# Patient Record
Sex: Female | Born: 1990 | Race: Black or African American | Hispanic: No | Marital: Married | Smoking: Never smoker
Health system: Southern US, Community
[De-identification: ages and names within clinical notes are randomized; demographics above are authoritative.]

## PROBLEM LIST (undated history)

## (undated) DIAGNOSIS — J45909 Unspecified asthma, uncomplicated: Secondary | ICD-10-CM

## (undated) DIAGNOSIS — Z91018 Allergy to other foods: Secondary | ICD-10-CM

---

## 2015-02-26 ENCOUNTER — Emergency Department (HOSPITAL_COMMUNITY): Payer: No Typology Code available for payment source

## 2015-02-26 ENCOUNTER — Emergency Department (HOSPITAL_COMMUNITY)
Admission: EM | Admit: 2015-02-26 | Discharge: 2015-02-26 | Disposition: A | Discharge status: Home / Self Care | Payer: No Typology Code available for payment source | Attending: Emergency Medicine | Admitting: Emergency Medicine

## 2015-02-26 DIAGNOSIS — Z79899 Other long term (current) drug therapy: Secondary | ICD-10-CM | POA: Diagnosis not present

## 2015-02-26 DIAGNOSIS — Y92410 Unspecified street and highway as the place of occurrence of the external cause: Secondary | ICD-10-CM | POA: Diagnosis not present

## 2015-02-26 DIAGNOSIS — Y9389 Activity, other specified: Secondary | ICD-10-CM | POA: Insufficient documentation

## 2015-02-26 DIAGNOSIS — S299XXA Unspecified injury of thorax, initial encounter: Secondary | ICD-10-CM | POA: Diagnosis not present

## 2015-02-26 DIAGNOSIS — Z7951 Long term (current) use of inhaled steroids: Secondary | ICD-10-CM | POA: Diagnosis not present

## 2015-02-26 DIAGNOSIS — J45909 Unspecified asthma, uncomplicated: Secondary | ICD-10-CM | POA: Insufficient documentation

## 2015-02-26 DIAGNOSIS — Y998 Other external cause status: Secondary | ICD-10-CM | POA: Insufficient documentation

## 2015-02-26 DIAGNOSIS — S199XXA Unspecified injury of neck, initial encounter: Secondary | ICD-10-CM | POA: Insufficient documentation

## 2015-02-26 MED ORDER — IBUPROFEN 800 MG PO TABS: 800.0000 mg | ORAL_TABLET | Freq: Three times a day (TID) | ORAL | Status: DC
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
## 2015-02-26 NOTE — Discharge Instructions (Signed)
Take your medications as prescribed as needed for pain relief. You may also apply ice to affected areas for 15-20 min 3-4 times daily as needed for pain relief. Please follow up with a primary care provider from the Resource Guide provided below in 1 week. Please return to the Emergency Department if symptoms worsen or new onset of fever, difficulty breathing, numbness, tingling, weakness.    Emergency Department Resource Guide 1) Find a Doctor and Pay Out of Pocket Although you won't have to find out who is covered by your insurance plan, it is a good idea to ask around and get recommendations. You will then need to call the office and see if the doctor you have chosen will accept you as a new patient and what types of options they offer for patients who are self-pay. Some doctors offer discounts or will set up payment plans for their patients who do not have insurance, but you will need to ask so you aren't surprised when you get to your appointment.  2) Contact Your Local Health Department Not all health departments have doctors that can see patients for sick visits, but many do, so it is worth a call to see if yours does. If you don't know where your local health department is, you can check in your phone book. The CDC also has a tool to help you locate your state's health department, and many state websites also have listings of all of their local health departments.  3) Find a [facility] If your illness is not likely to be very severe or complicated, you may want to try a walk in clinic. These are popping up all over the country in pharmacies, drugstores, and shopping centers. They're usually staffed by nurse practitioners or physician assistants that have been trained to treat common illnesses and complaints. They're usually fairly quick and inexpensive. However, if you have serious medical issues or chronic medical problems, these are probably not your best option.  No Primary Care  Doctor: - Call Health Connect at  [phone] - they can help you locate a primary care doctor that  accepts your insurance, provides certain services, etc. - Physician Referral Service- [phone]  Chronic Pain Problems: Organization         Address  Phone   Notes  [facility]  [phone] Patients need to be referred by their primary care doctor.   Medication Assistance: Organization         Address  Phone   Notes  [facility] Medication [facility] [address] [place], [place] [address] [phone] --Must be a resident of [facility] -- Must have NO insurance coverage whatsoever (no Medicaid/ Medicare, etc.) -- The pt. MUST have a primary care doctor that directs their care regularly and follows them in the community   MedAssist  [phone]   [employer]  [phone]    Agencies that provide inexpensive medical care: Organization         Address  Phone   Notes  [place]  [phone]   [facility]    [phone]   [facility] [place], [place] [address] [phone]   [place] [address] [place] [phone]   Planned Parenthood    [phone]   [facility]    [phone]   [place] and [place]  [address], [place] Phone:  [phone], Fax:  [phone] Hours of Operation:  9 am - 6 pm, M-F.  Also accepts Medicaid/Medicare and self-pay.  [facility] for [place] [address], [address], [place] Phone: [phone], Fax: [phone]. Hours of Operation:  8:30 am - 5:30 pm, M-F.  Also accepts Medicaid and self-pay.  [facility] [place] [address], [place] Phone: [phone]   [place], [place], [place] [phone], Ext. [phone] Mondays & Thursdays: 7-9 AM.  First 15 patients are seen on a first  come, first serve basis.    [place] Providers:  Organization         Address  Phone   Notes  [facility] [address], [place] [phone] Also accepts self-pay patients.  [facility] [address], [place]  [phone]   [place], [address], [place] [phone]   [facility] [address], [place] [phone]   Lucianne Lei [address], [place]   [phone] Only accepts [place] Access [place] patients after they have their name applied to their card.   Self-Pay (no insurance) in [facility]:  Organization         Address  Phone   Notes  Sickle Cell Patients, [facility] [place] [phone]   [facility] [place] [phone]   [facility] [place]  [place], [place], [place] [phone]   [facility]/Dr. Osei-Bonsu  [address], [place] or [address], [place] [phone] Phone number for both [place] and [place] locations is the same.  Urgent Medical and [facility] [address], [place] [phone]   [facility] [address], [place] or [address] [phone]   [facility] [address], [place] [phone], phone; [phone], fax Sees patients 1st and 3rd Saturday of every month.  Must not qualify for public or private insurance (i.e. Medicaid, Medicare, [place] Health Choice, Veterans' Benefits)  Household income should be no more than 200% of the poverty level The clinic cannot treat you if you are pregnant or think you are pregnant  Sexually transmitted diseases are not treated at the clinic.    Dental Care: Organization          Address  Phone  Notes  [facility] of [facility] [place] [phone] Accepts children up to age 13 who are enrolled in [place] or [place]; pregnant women with a Medicaid card; and children who have applied for Medicaid or [place] Health Choice, but were declined, whose parents can pay a reduced fee at time of service.  [facility] Department of [facility]  [address], [place] [phone] Accepts children up to age 8 who are enrolled in [place] or [place]; pregnant women with a Medicaid card; and children who have applied for Medicaid or [place] Health Choice, but were declined, whose parents can pay a reduced fee at time of service.  [facility] Adult Dental Access PROGRAM  [place] [phone] Patients are  seen by appointment only. Walk-ins are not accepted. [place] will see patients 20 years of age and older. Monday - Tuesday (8am-5pm) Most Wednesdays (8:30-5pm) $30 per visit, cash only  [facility] Adult Dental Access PROGRAM  [address], [facility] [phone] Patients are seen by appointment only. Walk-ins are not accepted. [place] will see patients 57 years of age and older. One Wednesday Evening (Monthly: Volunteer Based).  $30 per visit, cash only  [place]  [phone] for adults; Children under age 71, call Graduate Pediatric Dentistry at [phone]. Children aged 38-14, please call [phone] to request a pediatric application.  Dental services are provided in all areas of dental care including fillings, crowns and bridges, complete and partial dentures, implants, gum treatment, root canals, and extractions. Preventive care is also provided. Treatment is provided to both adults and children. Patients are selected via a lottery and there is often a waiting list.   [facility] [address], [place]  [phone] [email or web address]   [facility] [address] [place], [place] [phone], Ext. [phone] Second and Fourth Thursday of each month, opens at 6:30 AM; Clinic ends at 9 AM.  Patients are seen on a first-come first-served basis, and a limited number are seen during each clinic.   [facility]  [address] Hillard Danker [place], [place] [phone]   Eligibility Requirements You must have lived in [place], [place], or [place] counties for at least the last three months.   You cannot be eligible for state or federal sponsored [employer], including [employer], [place], or [employer].   You generally cannot be eligible for healthcare insurance through your employer.    How to apply: Eligibility screenings are held every Tuesday and Wednesday afternoon from 1:00 pm until 4:00 pm. You do not need an appointment for the interview!  [facility] [address] [place], [place]   [place]  [place] Department  [place]  [phone]    Behavioral Health Resources in the Community: Intensive Outpatient Programs Organization         Address  Phone  Notes  [place]. [address] [place], [place] [phone]   [facility] [address], [place], [place]   ADS: Alcohol & Drug Svcs [address] [place], [place]   [place] [address]  [place], [place] or [phone]   Substance Abuse Resources Organization         Address  Phone  Notes  Alcohol and Drug Services  [phone]   [place]  [phone]   The [place]   Chinita Pester  [phone]   Residential & Outpatient Substance Abuse Program  [phone]   Psychological  Services Organization         Address  Phone  Notes  [facility] [place]  [place]  [phone]   [place] [address] [place] or [phone]    Mobile Crisis Teams Organization         Address  Phone  Notes  [facility], Mobile Crisis Care Unit  [phone]   Assertive Psychotherapeutic Services  [address] [place], [place]   [facility] [address] [place]  [place] [phone]    Self-Help/Support Groups Organization         Address  Phone             Notes  [facility]ental [facility] of [place] - variety of support groups  [place] Call for more information  Narcotics Anonymous (NA), [facility]aring [facility] [address], [employer] [place]  2 meetings at this location   Special educational needs teacher         Address  Phone  Notes  [facility] Residential [facility] [place],    [place]  [phone]   [facility]  [address] [place] [phone], [place], [place]   [place], [place] [phone] Admissions: 8am-3pm M-F  Incentives Substance [place] [address],    [place], [place] [phone]   [facility] [address] [place], [place], [place]   [facility] [address]  [place], [place]   [facility] Programs - Intensive Outpatient [address] Kristeen [address], [place], [place]   [facility] ([place].,  [place], [place] [phone] or [phone]   [facility] (RTS) [address] [place], [place] Accepts Medicaid  [facility] [place] [address]  [place] [phone] Substance Abuse/Addiction Treatment   [facility] Organization         Address  Phone  Notes  [facility]  [phone]   Domenic Schwab, PhD [address] Arlis [place], [place]   [phone] or [phone]   [place] Ber[place]ley [place], [place] [phone]   [facility] [address] [place], [place] [phone] Insurance/Medicaid/sponsorship through [facility] and Families [address] [place]                                    [place], [place] [phone] [place] [address], [place] [phone]    Dr. Adele Schilder  [phone]   [facility] [place] Dept. 1) [address] [place] 2) [place] 3)  [place] [address], [place] [phone]  [phone]   [place] [phone] or [phone] (After Hours)
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
## 2016-02-12 ENCOUNTER — Emergency Department (INDEPENDENT_AMBULATORY_CARE_PROVIDER_SITE_OTHER)
Admission: EM | Admit: 2016-02-12 | Discharge: 2016-02-12 | Disposition: A | Discharge status: Home / Self Care | Payer: BLUE CROSS/BLUE SHIELD | Source: Home / Self Care | Attending: Family Medicine | Admitting: Family Medicine

## 2016-02-12 ENCOUNTER — Encounter: Payer: Self-pay | Admitting: Emergency Medicine

## 2016-02-12 DIAGNOSIS — R300 Dysuria: Secondary | ICD-10-CM

## 2016-02-12 HISTORY — DX: Unspecified asthma, uncomplicated: J45.909

## 2016-02-12 HISTORY — DX: Allergy to other foods: Z91.018

## 2016-02-12 MED ORDER — NITROFURANTOIN MONOHYD MACRO 100 MG PO CAPS: 100.0000 mg | ORAL_CAPSULE | Freq: Two times a day (BID) | ORAL | 0 refills | Status: DC

## 2016-02-12 NOTE — Discharge Instructions (Signed)
Increase fluid intake. May use non-prescription AZO for about two days, if desired, to decrease urinary discomfort.  If symptoms become significantly worse during the night or over the weekend, proceed to the local emergency room.  

## 2016-02-12 NOTE — ED Provider Notes (Signed)
[name]    CSN: [id number] Arrival date & time: 02/12/16  1154     History   Chief Complaint Chief Complaint  Patient presents with  . Dysuria  . Hematuria    HPI Andrea Wheeler is a 25 y.o. female.   Patient complains of dysuria for five days.  Two days ago she noticed some hematuria that has now resolved.   She denies abdominal/pelvic pain, and no fevers, chills, and sweats.  Patient's last menstrual period was 01/22/2016 (exact date).    The history is provided by the patient.  Dysuria  Pain quality:  Burning Pain severity:  Mild Onset quality:  Sudden Timing:  Constant Progression:  Unchanged Chronicity:  New Recent urinary tract infections: no   Relieved by:  Phenazopyridine Worsened by:  Nothing Urinary symptoms: frequent urination, hematuria and hesitancy   Urinary symptoms: no discolored urine, no foul-smelling urine and no bladder incontinence   Associated symptoms: no abdominal pain, no fever, no flank pain, no genital lesions, no nausea, no vaginal discharge and no vomiting   Hematuria  This is a new problem. The current episode started 2 days ago. The problem occurs rarely. The problem has been resolved. Pertinent negatives include no abdominal pain. Nothing aggravates the symptoms.    Past Medical History:  Diagnosis Date  . 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 Asthma   . Asthma due to seasonal allergies     There are no active problems to display for this patient.   History reviewed. No pertinent surgical history.  OB History    No data available       Home Medications    Prior to Admission medications   Medication Sig Start Date End Date Taking? Authorizing Provider  albuterol (PROVENTIL HFA;VENTOLIN HFA) 108 (90 BASE) MCG/ACT inhaler Inhale 1-2 puffs into the lungs every 6 (six) hours as needed for wheezing or shortness of breath.    Historical Provider, MD  azelastine (ASTELIN) 0.1 % nasal spray Place 1 spray into both nostrils  daily.  02/07/15   Historical Provider, MD  BREO ELLIPTA 200-25 MCG/INH AEPB Inhale 1 puff into the lungs daily as needed (for shortness of breath).  02/07/15   Historical Provider, MD  fluticasone (FLONASE) 50 MCG/ACT nasal spray Place 1 spray into both nostrils daily. 12/22/14   Historical Provider, MD  ibuprofen (ADVIL,MOTRIN) 800 MG tablet Take 1 tablet (800 mg total) by mouth 3 (three) times daily. 02/26/15   Nona Dell, PA-C  levocetirizine (XYZAL) 5 MG tablet Take 5 mg by mouth every evening. 02/07/15   Historical Provider, MD  montelukast (SINGULAIR) 10 MG tablet Take 10 mg by mouth every evening. 02/07/15   Historical Provider, MD  nitrofurantoin, macrocrystal-monohydrate, (MACROBID) 100 MG capsule Take 1 capsule (100 mg total) by mouth 2 (two) times daily. Take with food. 02/12/16   Kandra Nicolas, MD    Family History Family History  Problem Relation Age of Onset  . Hypertension Mother     Social History Social History  Substance Use Topics  . Smoking status: Never Smoker  . Smokeless tobacco: Never Used  . Alcohol use No     Allergies   Patient has no known allergies.   Review of Systems Review of Systems  Constitutional: Negative for fever.  Gastrointestinal: Negative for abdominal pain, nausea and vomiting.  Genitourinary: Positive for dysuria and hematuria. Negative for flank pain and vaginal discharge.  All other systems reviewed and are negative.    Physical  Exam Triage Vital Signs ED Triage Vitals  Enc Vitals Group     BP 02/12/16 1234 114/76     Pulse --      Resp 02/12/16 1234 16     Temp 02/12/16 1234 98 F (36.7 C)     Temp Source 02/12/16 1234 Oral     SpO2 02/12/16 1234 100 %     Weight 02/12/16 1235 150 lb (68 kg)     Height 02/12/16 1235 5\' 2"  (1.575 m)     Head Circumference --      Peak Flow --      Pain Score 02/12/16 1238 2     Pain Loc --      Pain Edu? --      Excl. in [place]? --    No data found.   Updated Vital  Signs BP 114/76 (BP Location: Left Arm)   Temp 98 F (36.7 C) (Oral)   Resp 16   Ht 5\' 2"  (1.575 m)   Wt 150 lb (68 kg)   LMP 01/22/2016 (Exact Date)   SpO2 100%   BMI 27.44 kg/m   Visual Acuity Right Eye Distance:   Left Eye Distance:   Bilateral Distance:    Right Eye Near:   Left Eye Near:    Bilateral Near:     Physical Exam Nursing notes and Vital Signs reviewed. Appearance:  Patient appears stated age, and in no acute distress.    Eyes:  Pupils are equal, round, and reactive to light and accomodation.  Extraocular movement is intact.  Conjunctivae are not inflamed   Pharynx:  Normal; moist mucous membranes  Neck:  Supple.  No adenopathy Lungs:  Clear to auscultation.  Breath sounds are equal.  Moving air well. Heart:  Regular rate and rhythm without murmurs, rubs, or gallops.  Abdomen:  Nontender without masses or hepatosplenomegaly.  Bowel sounds are present.  No CVA or flank tenderness.  Extremities:  No edema.  Skin:  No rash present.     UC Treatments / Results  Labs (all labs ordered are listed, but only abnormal results are displayed) Labs Reviewed  URINE CULTURE    EKG  EKG Interpretation None       Radiology No results found.  Procedures Procedures (including critical care time)  Medications Ordered in UC Medications - No data to display   Initial Impression / Assessment and Plan / UC Course  I have reviewed the triage vital signs and the nursing notes.  Pertinent labs & imaging results that were available during my care of the patient were reviewed by me and considered in my medical decision making (see chart for details).  Clinical Course   Suspect cystitis Urine culture pending. Begin Macrobid 100mg  BID for one week. Increase fluid intake. If symptoms become significantly worse during the night or over the weekend, proceed to the local emergency room.  Followup with Family Doctor if not improved in one week.      Final Clinical  Impressions(s) / UC Diagnoses   Final diagnoses:  Dysuria    New Prescriptions New Prescriptions   NITROFURANTOIN, MACROCRYSTAL-MONOHYDRATE, (MACROBID) 100 MG CAPSULE    Take 1 capsule (100 mg total) by mouth 2 (two) times daily. Take with food.     Kandra Nicolas, MD 02/13/16 [phone]

## 2016-02-12 NOTE — ED Triage Notes (Signed)
Patient gives 5 day history of dysuria and hematuria; she has been taking AZO.

## 2016-02-13 LAB — URINE CULTURE: Organism ID, Bacteria: NO GROWTH

## 2016-02-14 ENCOUNTER — Telehealth: Payer: Self-pay | Admitting: *Deleted

## 2016-02-14 NOTE — Telephone Encounter (Signed)
Spoke to pt given Ucx results. She reports that she is feeling better. Complete ABT and f/u with PCP if she fails to improve or worsens.

## 2016-04-15 ENCOUNTER — Encounter: Payer: Self-pay | Admitting: Emergency Medicine

## 2016-04-15 ENCOUNTER — Emergency Department
Admission: EM | Admit: 2016-04-15 | Discharge: 2016-04-15 | Disposition: A | Discharge status: Home / Self Care | Payer: BLUE CROSS/BLUE SHIELD | Source: Home / Self Care | Attending: Family Medicine | Admitting: Family Medicine

## 2016-04-15 DIAGNOSIS — L0291 Cutaneous abscess, unspecified: Secondary | ICD-10-CM | POA: Diagnosis not present

## 2016-04-15 MED ORDER — CLINDAMYCIN HCL 300 MG PO CAPS: 300.0000 mg | ORAL_CAPSULE | Freq: Three times a day (TID) | ORAL | 0 refills | Status: DC

## 2016-04-15 MED ORDER — TRAMADOL HCL 50 MG PO TABS: 50.0000 mg | ORAL_TABLET | Freq: Four times a day (QID) | ORAL | 0 refills | Status: DC | PRN

## 2016-04-15 NOTE — ED Triage Notes (Signed)
Pt c/o abscess on left side x 3 days, itchy, no drainage, more painful yesterday.

## 2016-04-15 NOTE — ED Provider Notes (Signed)
CSN: [id number]     Arrival date & time 04/15/16  1109 History   First MD Initiated Contact with Patient 04/15/16 1129     Chief Complaint  Patient presents with  . Abscess   (Consider location/radiation/quality/duration/timing/severity/associated sxs/prior Treatment) HPI  Andrea Wheeler is a 26 y.o. female presenting to UC with c/o gradually worsening pain and swelling on her Left side for about 3 days.  Hx of similar symptoms a few years ago but notes the area eventually popped and drained on its own.  Yesterday, current spot became more painful.  Denies fever, chills, n/v/d. No bleeding or drainage from the area. 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    Past Medical History:  Diagnosis Date  . 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 Asthma   . Asthma due to seasonal allergies    History reviewed. No pertinent surgical history. Family History  Problem Relation Age of Onset  . Hypertension Mother    Social History  Substance Use Topics  . Smoking status: Never Smoker  . Smokeless tobacco: Never Used  . Alcohol use No   OB History    No data available     Review of Systems  Constitutional: Negative for chills and fever.  Gastrointestinal: Negative for nausea and vomiting.  Musculoskeletal: Negative for arthralgias and myalgias.  Skin: Positive for color change. Negative for wound.    Allergies  Patient has no known allergies.  Home Medications   Prior to Admission medications   Medication Sig Start Date End Date Taking? Authorizing Provider  albuterol (PROVENTIL HFA;VENTOLIN HFA) 108 (90 BASE) MCG/ACT inhaler Inhale 1-2 puffs into the lungs every 6 (six) hours as needed for wheezing or shortness of breath.    Historical Provider, MD  clindamycin (CLEOCIN) 300 MG capsule Take 1 capsule (300 mg total) by mouth 3 (three) times daily. X 7 days 04/15/16   Noland Fordyce, PA-C  levocetirizine (XYZAL) 5  MG tablet Take 5 mg by mouth every evening. 02/07/15   Historical Provider, MD  montelukast (SINGULAIR) 10 MG tablet Take 10 mg by mouth every evening. 02/07/15   Historical Provider, MD  traMADol (ULTRAM) 50 MG tablet Take 1 tablet (50 mg total) by mouth every 6 (six) hours as needed. 04/15/16   Noland Fordyce, PA-C   Meds Ordered and Administered this Visit  Medications - No data to display  BP 127/85 (BP Location: Left Arm)   Pulse 88   Temp 98.4 F (36.9 C) (Oral)   Ht 5\' 2"  (1.575 m)   Wt 174 lb 8 oz (79.2 kg)   LMP 04/15/2016 (Exact Date)   SpO2 100%   BMI 31.92 kg/m  No data found.   Physical Exam  Constitutional: She is oriented to person, place, and time. She appears well-developed and well-nourished. No distress.  HENT:  Head: Normocephalic and atraumatic.  Eyes: EOM are normal.  Neck: Normal range of motion.  Cardiovascular: Normal rate.   Pulmonary/Chest: Effort normal.    Musculoskeletal: Normal range of motion.  Neurological: She is alert and oriented to person, place, and time.  Skin: Skin is warm and dry. She is not diaphoretic.  Left side of trunk: 2cm area of faint erythema, fluctuance and tenderness. No bleeding or drainage.   Psychiatric: She has a normal mood and affect. Her behavior is normal.  Nursing note and vitals reviewed.   Urgent  Care Course   Clinical Course     .Marland KitchenIncision and Drainage Date/Time: 04/15/2016 1:31 PM Performed by: Noland Fordyce Authorized by: Theone Murdoch A   Consent:    Consent obtained:  Verbal   Consent given by:  Patient   Risks discussed:  Bleeding, incomplete drainage, infection and pain   Alternatives discussed:  Alternative treatment Location:    Type:  Abscess   Size:  2   Location:  Trunk   Trunk location: Left flank. Pre-procedure details:    Procedure prep: alcohol swab. Anesthesia (see MAR for exact dosages):    Anesthesia method:  Local infiltration   Local anesthetic:  Lidocaine 2% WITH  epi Procedure type:    Complexity:  Simple Procedure details:    Incision types:  Single straight   Incision depth:  Subcutaneous   Scalpel blade:  11   Wound management:  Probed and deloculated   Drainage:  Purulent and bloody   Drainage amount:  Moderate   Wound treatment:  Wound left open   Packing materials:  None Post-procedure details:    Patient tolerance of procedure:  Tolerated well, no immediate complications   (including critical care time)  Labs Review Labs Reviewed  WOUND CULTURE    Imaging Review No results found.   MDM   1. Abscess    Pt c/o worsening abscess on Left side for 4 days.  I&D successfully performed. Wound culture sent.  Rx: clindamycin and tramadol as needed for severe pain F/u in 3-4 days if not improving.     Noland Fordyce, PA-C 04/15/16 [place], PA-C 04/15/16 1333

## 2016-04-15 NOTE — Discharge Instructions (Signed)
°  Tramadol is strong pain medication. While taking, do not drink alcohol, drive, or perform any other activities that requires focus while taking these medications.  ° °

## 2016-04-17 ENCOUNTER — Telehealth: Payer: Self-pay

## 2016-04-17 LAB — WOUND CULTURE
Gram Stain: NONE SEEN
Organism ID, Bacteria: NO GROWTH

## 2016-04-17 NOTE — Telephone Encounter (Signed)
Left a message with lab results, and to call if any questions.

## 2016-08-13 ENCOUNTER — Ambulatory Visit (INDEPENDENT_AMBULATORY_CARE_PROVIDER_SITE_OTHER): Payer: BLUE CROSS/BLUE SHIELD | Admitting: Osteopathic Medicine

## 2016-08-13 ENCOUNTER — Encounter: Payer: Self-pay | Admitting: Osteopathic Medicine

## 2016-08-13 VITALS — BP 130/78 | HR 75 | Ht 62.0 in | Wt 176.0 lb

## 2016-08-13 DIAGNOSIS — J4540 Moderate persistent asthma, uncomplicated: Secondary | ICD-10-CM | POA: Diagnosis not present

## 2016-08-13 DIAGNOSIS — Z0000 Encounter for general adult medical examination without abnormal findings: Secondary | ICD-10-CM

## 2016-08-13 DIAGNOSIS — Z79899 Other long term (current) drug therapy: Secondary | ICD-10-CM

## 2016-08-13 DIAGNOSIS — R350 Frequency of micturition: Secondary | ICD-10-CM

## 2016-08-13 DIAGNOSIS — Z975 Presence of (intrauterine) contraceptive device: Secondary | ICD-10-CM

## 2016-08-13 DIAGNOSIS — Z13220 Encounter for screening for lipoid disorders: Secondary | ICD-10-CM

## 2016-08-13 DIAGNOSIS — R300 Dysuria: Secondary | ICD-10-CM

## 2016-08-13 LAB — POCT URINALYSIS DIPSTICK
Bilirubin, UA: NEGATIVE
GLUCOSE UA: NEGATIVE
Ketones, UA: NEGATIVE
NITRITE UA: NEGATIVE
PROTEIN UA: NEGATIVE
UROBILINOGEN UA: 0.2 U/dL
pH, UA: 6 (ref 5.0–8.0)

## 2016-08-13 MED ORDER — SULFAMETHOXAZOLE-TRIMETHOPRIM 800-160 MG PO TABS: 1.0000 | ORAL_TABLET | Freq: Two times a day (BID) | ORAL | 0 refills | Status: DC

## 2016-08-13 MED ORDER — ALBUTEROL SULFATE HFA 108 (90 BASE) MCG/ACT IN AERS: 1.0000 | INHALATION_SPRAY | RESPIRATORY_TRACT | 3 refills | Status: DC | PRN

## 2016-08-13 MED ORDER — MONTELUKAST SODIUM 10 MG PO TABS: 10.0000 mg | ORAL_TABLET | Freq: Every day | ORAL | 3 refills | Status: DC

## 2016-08-13 MED ORDER — LEVOCETIRIZINE DIHYDROCHLORIDE 5 MG PO TABS: 5.0000 mg | ORAL_TABLET | Freq: Every evening | ORAL | 3 refills | Status: DC

## 2016-08-13 NOTE — Patient Instructions (Signed)
Thank you for coming in today! You should receive an email asking you to complete a brief survey regarding your experience today at [facility]. Please take a moment to respond to this survey. Our goal is to serve you! Constructive criticism helps [place] to improve, and positive feedback helps our practice to shine, plus it makes [place] smile! Thanks in advance for your feedback.   Please note: Preventive care issues were addressed today per annual physical requirements and should be covered under your insurance, however there were other medical issues which were also addressed and insurance may bill you separately for "problem-based visit" for UTI and asthma. Any questions or concerns about charges which may appear on your bill should be directed to your insurance company or to [facility] billing department, please contact our office with any other questions.

## 2016-08-13 NOTE — Progress Notes (Signed)
HPI: Andrea Wheeler is a 26 y.o. female  who presents to [place] today, 08/13/16,  for chief complaint of:  Chief Complaint  Patient presents with  . Establish Care    ANNUAL AND POSSIBLE UTI    Patient here for annual physical / wellness exam.  See preventive care reviewed as below.    Additional concerns/Medical issues reviewed today include:   Urine: Frequency/dysuria for 1-2 days, associated with some suprapubic discomfort, no nausea/fever. No flank pain.  Asthma/Allergies: Well-controlled on current regimen of daily 30 and monthly cast, also takes Flonase and levocetirizine, needs refill of when necessary albuterol  IUD: Patient states had ParaGard placed at her last Pap smear she thinks in 2016.   Past medical, surgical, social and family history reviewed: Patient Active Problem List   Diagnosis Date Noted  . Moderate persistent asthma 08/13/2016  . IUD (intrauterine device) in place 08/13/2016   History reviewed. No pertinent surgical history.   Social History  Substance Use Topics  . Smoking status: Never Smoker  . Smokeless tobacco: Never Used  . Alcohol use No   Family History  Problem Relation Age of Onset  . Hypertension Mother      Current medication list and allergy/intolerance information reviewed:   Current Outpatient Prescriptions  Medication Sig Dispense Refill  . albuterol (PROVENTIL HFA;VENTOLIN HFA) 108 (90 BASE) MCG/ACT inhaler Inhale 1-2 puffs into the lungs every 6 (six) hours as needed for wheezing or shortness of breath.    Marland Kitchen BREO ELLIPTA 200-25 MCG/INH AEPB I PUFF BY MOUTH EVERY DAY  4  . levocetirizine (XYZAL) 5 MG tablet Take 5 mg by mouth every evening.  2  . montelukast (SINGULAIR) 10 MG tablet Take 10 mg by mouth every evening.  2   No current facility-administered medications for this visit.    No Known Allergies    Review of Systems:  Constitutional:  No  fever, no chills, No recent  illness, No unintentional weight changes. No significant fatigue.   HEENT: No  headache, no vision change, no hearing change, No sore throat, No  sinus pressure  Cardiac: No  chest pain, No  pressure, No palpitations, No  Orthopnea  Respiratory:  No  shortness of breath. No  Cough  Gastrointestinal: No  abdominal pain, No  nausea, No  vomiting,  No  blood in stool, No  diarrhea, No  constipation   Musculoskeletal: No new myalgia/arthralgia  Genitourinary: No  incontinence, No  abnormal genital bleeding, No abnormal genital discharge  Skin: No  Rash, No other wounds/concerning lesions  Hem/Onc: No  easy bruising/bleeding, No  abnormal lymph node  Endocrine: No cold intolerance,  No heat intolerance. No polyuria/polydipsia/polyphagia   Neurologic: No  weakness, No  dizziness, No  slurred speech/focal weakness/facial droop  Psychiatric: No  concerns with depression, No  concerns with anxiety, No sleep problems, No mood problems  Exam:  BP 130/78   Pulse 75   Ht 5\' 2"  (1.575 m)   Wt 176 lb (79.8 kg)   BMI 32.19 kg/m   Constitutional: VS see above. General Appearance: alert, well-developed, well-nourished, NAD  Eyes: Normal lids and conjunctive, non-icteric sclera  Ears, Nose, Mouth, Throat: MMM, Normal external inspection ears/nares/mouth/lips/gums. TM normal bilaterally. Pharynx/tonsils no erythema, no exudate. Nasal mucosa normal.   Neck: No masses, trachea midline. No thyroid enlargement. No tenderness/mass appreciated. No lymphadenopathy  Respiratory: Normal respiratory effort. no wheeze, no rhonchi, no rales  Cardiovascular: S1/S2 normal, no murmur, no  rub/gallop auscultated. RRR.   Gastrointestinal: Nontender, no masses. No hepatomegaly, no splenomegaly. No hernia appreciated. Bowel sounds normal. Rectal exam deferred.   Musculoskeletal: Gait normal. No clubbing/cyanosis of digits.   Neurological: Normal balance/coordination. No tremor. No cranial nerve deficit on  limited exam. Motor and sensation intact and symmetric. Cerebellar reflexes intact.   Skin: warm, dry, intact. No rash/ulcer. No concerning nevi or subq nodules on limited exam.    Psychiatric: Normal judgment/insight. Normal mood and affect. Oriented x3.    Results for orders placed or performed in visit on 08/13/16 (from the past 24 hour(s))  POCT Urinalysis Dipstick     Status: Abnormal   Collection Time: 08/13/16  1:55 PM  Result Value Ref Range   Color, UA yellow    Clarity, UA clear    Glucose, UA negative    Bilirubin, UA negative    Ketones, UA negative    Spec Grav, UA >=1.030 (A) 1.010 - 1.025   Blood, UA small    pH, UA 6.0 5.0 - 8.0   Protein, UA negative    Urobilinogen, UA 0.2 0.2 or 1.0 E.U./dL   Nitrite, UA negative    Leukocytes, UA Trace (A) Negative     ASSESSMENT/PLAN:   Annual physical exam  Frequency of urination - Plan: POCT Urinalysis Dipstick, sulfamethoxazole-trimethoprim (BACTRIM DS) 800-160 MG tablet  Dysuria - Plan: Urine Culture  Moderate persistent asthma, unspecified whether complicated - Plan: montelukast (SINGULAIR) 10 MG tablet, levocetirizine (XYZAL) 5 MG tablet, albuterol (PROVENTIL HFA;VENTOLIN HFA) 108 (90 Base) MCG/ACT inhaler  Lipid screening - Plan: Lipid panel  Medication management - Plan: CBC with Differential/Platelet, COMPLETE METABOLIC PANEL WITH GFR  IUD (intrauterine device) in place    FEMALE PREVENTIVE CARE Updated 08/13/16   ANNUAL SCREENING/COUNSELING  Diet/Exercise - HEALTHY HABITS DISCUSSED TO DECREASE CV RISK History  Smoking Status  . Never Smoker  Smokeless Tobacco  . Never Used   History  Alcohol Use No   Depression screen PHQ 2/9 08/13/2016  Decreased Interest 0  Down, Depressed, Hopeless 0  PHQ - 2 Score 0  Altered sleeping 0  Tired, decreased energy 0  Change in appetite 0  Feeling bad or failure about yourself  0  Trouble concentrating 0  Moving slowly or fidgety/restless 0  Suicidal  thoughts 0  PHQ-9 Score 0     Domestic violence concerns - no  HTN SCREENING - SEE [place]  Sexually active in the past year - Yes with female.  Need/want STI testing today? - no  Concerns about libido or pain with sex? - no  Plans for pregnancy? - Paragard in place   INFECTIOUS DISEASE SCREENING  HIV - needs - declined  GC/CT - needs - declined  HepC - DOB [birth year] - does not need  TB - does not need  DISEASE SCREENING  Lipid - does not need  DM2 - does not need  Osteoporosis - women age 72+ - does not need  CANCER SCREENING  Cervical - does not need  Breast - does not need  Lung - does not need  Colon - does not need  ADULT VACCINATION  Influenza - annual vaccine recommended  Td - booster every 10 years   Zoster - option at 50, yes at 60+   PCV13 - was not indicated  PPSV23 - was not indicated  There is no immunization history on file for this patient.   Visit summary with medication list and pertinent instructions was printed  for patient to review. All questions at time of visit were answered - patient instructed to contact office with any additional concerns. ER/RTC precautions were reviewed with the patient. Follow-up plan: Return in about 1 year (around 08/13/2017) for [place] .

## 2016-08-14 LAB — URINE CULTURE

## 2016-08-14 LAB — COMPLETE METABOLIC PANEL WITH GFR
ALBUMIN: 4.7 g/dL (ref 3.6–5.1)
ALK PHOS: 65 U/L (ref 33–115)
ALT: 10 U/L (ref 6–29)
AST: 19 U/L (ref 10–30)
BILIRUBIN TOTAL: 0.3 mg/dL (ref 0.2–1.2)
BUN: 11 mg/dL (ref 7–25)
CALCIUM: 9.4 mg/dL (ref 8.6–10.2)
CO2: 20 mmol/L (ref 20–31)
CREATININE: 0.83 mg/dL (ref 0.50–1.10)
Chloride: 106 mmol/L (ref 98–110)
Glucose, Bld: 83 mg/dL (ref 65–99)
Potassium: 3.9 mmol/L (ref 3.5–5.3)
Sodium: 138 mmol/L (ref 135–146)
TOTAL PROTEIN: 8.1 g/dL (ref 6.1–8.1)

## 2016-08-14 LAB — CBC WITH DIFFERENTIAL/PLATELET
BASOS ABS: 0 {cells}/uL (ref 0–200)
Basophils Relative: 0 %
EOS ABS: 106 {cells}/uL (ref 15–500)
Eosinophils Relative: 2 %
HEMATOCRIT: 38.1 % (ref 35.0–45.0)
HEMOGLOBIN: 12.2 g/dL (ref 11.7–15.5)
Lymphocytes Relative: 28 %
Lymphs Abs: 1484 cells/uL (ref 850–3900)
MCH: 28.2 pg (ref 27.0–33.0)
MCHC: 32 g/dL (ref 32.0–36.0)
MCV: 88 fL (ref 80.0–100.0)
MONO ABS: 371 {cells}/uL (ref 200–950)
MONOS PCT: 7 %
MPV: 10.4 fL (ref 7.5–12.5)
NEUTROS ABS: 3339 {cells}/uL (ref 1500–7800)
Neutrophils Relative %: 63 %
PLATELETS: 313 10*3/uL (ref 140–400)
RBC: 4.33 MIL/uL (ref 3.80–5.10)
RDW: 13.6 % (ref 11.0–15.0)
WBC: 5.3 10*3/uL (ref 3.8–10.8)

## 2016-08-14 LAB — LIPID PANEL
Cholesterol: 100 mg/dL (ref <200)
HDL: 29 mg/dL — ABNORMAL LOW (ref >50)
LDL Cholesterol: 63 mg/dL (ref <100)
TRIGLYCERIDES: 40 mg/dL (ref <150)
Total CHOL/HDL Ratio: 3.4 Ratio (ref <5.0)
VLDL: 8 mg/dL (ref <30)

## 2016-08-29 ENCOUNTER — Encounter: Payer: Self-pay | Admitting: Osteopathic Medicine

## 2016-08-29 ENCOUNTER — Ambulatory Visit (INDEPENDENT_AMBULATORY_CARE_PROVIDER_SITE_OTHER): Payer: BLUE CROSS/BLUE SHIELD | Admitting: Osteopathic Medicine

## 2016-08-29 VITALS — BP 116/75 | HR 86 | Temp 99.3°F | Ht 62.0 in | Wt 180.0 lb

## 2016-08-29 DIAGNOSIS — S00412A Abrasion of left ear, initial encounter: Secondary | ICD-10-CM

## 2016-08-29 NOTE — Progress Notes (Signed)
HPI: Andrea Wheeler is a 26 y.o. female  who presents to [place] today, 08/29/16,  for chief complaint of:  Chief Complaint  Patient presents with  . Ear Pain    LEFT     . Was cleaning L ear w/ qtip and peroxide, had sharp pain. Has some pressure in ears/sinuses, allergy issues - uses Flonase on occasion    Past medical history, surgical history, social history and family history reviewed.  Patient Active Problem List   Diagnosis Date Noted  . Moderate persistent asthma 08/13/2016  . IUD (intrauterine device) in place 08/13/2016    Current medication list and allergy/intolerance information reviewed.   Current Outpatient Prescriptions on File Prior to Visit  Medication Sig Dispense Refill  . albuterol (PROVENTIL HFA;VENTOLIN HFA) 108 (90 Base) MCG/ACT inhaler Inhale 1-2 puffs into the lungs every 4 (four) hours as needed for wheezing or shortness of breath. 1 Inhaler 3  . BREO ELLIPTA 200-25 MCG/INH AEPB I PUFF BY MOUTH EVERY DAY  4  . levocetirizine (XYZAL) 5 MG tablet Take 1 tablet (5 mg total) by mouth every evening. 90 tablet 3  . montelukast (SINGULAIR) 10 MG tablet Take 1 tablet (10 mg total) by mouth at bedtime. 90 tablet 3  . PARAGARD INTRAUTERINE COPPER IUD IUD 1 each by Intrauterine route. Due for removal 2026? 1 each 0  . sulfamethoxazole-trimethoprim (BACTRIM DS) 800-160 MG tablet Take 1 tablet by mouth 2 (two) times daily. 6 tablet 0   No current facility-administered medications on file prior to visit.    No Known Allergies    Review of Systems:  Constitutional: No recent illness  HEENT: No  headache, Era pain as per HPI, sinus pressure as per HPI  Respiratory:  No  shortness of breath. No  Cough  Exam:  BP 116/75   Pulse 86   Ht 5\' 2"  (1.575 m)   Wt 180 lb (81.6 kg)   BMI 32.92 kg/m   Constitutional: VS see above. General Appearance: alert, well-developed, well-nourished, NAD  Eyes: Normal lids and  conjunctive, non-icteric sclera  Ears, Nose, Mouth, Throat: MMM, Normal external inspection ears/nares/mouth/lips/gums. L TM normal w/ mild clear effusion behind, canal appears very slightly irritated, small ?excoriation at 10:00 deep in canal looks like when cerumen takes off some skin of the canal when comes free   Neck: No masses, trachea midline.   Respiratory: Normal respiratory effort.     ASSESSMENT/PLAN:   Abrasion of left ear canal, initial encounter - rest, use mineral oil or Debrox to loosen wax, avoid Qtip use, use Flonase consistently for sinuses and eustachian tube dysfunction. RTC if need cerumen removal.      Follow-up plan: Return if symptoms worsen or fail to improve, orif recurrence of cerumen problem.   All questions at time of visit were answered - patient instructed to contact office with any additional concerns. ER/RTC precautions were reviewed with the patient and understanding verbalized.

## 2017-03-18 ENCOUNTER — Ambulatory Visit (INDEPENDENT_AMBULATORY_CARE_PROVIDER_SITE_OTHER): Payer: 59 | Admitting: Osteopathic Medicine

## 2017-03-18 ENCOUNTER — Other Ambulatory Visit (HOSPITAL_COMMUNITY)
Admission: RE | Admit: 2017-03-18 | Discharge: 2017-03-18 | Disposition: A | Discharge status: Home / Self Care | Payer: 59 | Source: Ambulatory Visit | Attending: Family Medicine | Admitting: Family Medicine

## 2017-03-18 VITALS — BP 132/69 | HR 97 | Wt 177.0 lb

## 2017-03-18 DIAGNOSIS — Z30431 Encounter for routine checking of intrauterine contraceptive device: Secondary | ICD-10-CM | POA: Diagnosis not present

## 2017-03-18 DIAGNOSIS — J4540 Moderate persistent asthma, uncomplicated: Secondary | ICD-10-CM | POA: Diagnosis not present

## 2017-03-18 DIAGNOSIS — Z113 Encounter for screening for infections with a predominantly sexual mode of transmission: Secondary | ICD-10-CM

## 2017-03-18 DIAGNOSIS — L720 Epidermal cyst: Secondary | ICD-10-CM | POA: Diagnosis not present

## 2017-03-18 DIAGNOSIS — Z124 Encounter for screening for malignant neoplasm of cervix: Secondary | ICD-10-CM | POA: Diagnosis not present

## 2017-03-18 DIAGNOSIS — N898 Other specified noninflammatory disorders of vagina: Secondary | ICD-10-CM

## 2017-03-18 LAB — WET PREP FOR TRICH, YEAST, CLUE
MICRO NUMBER:: 81414884
SPECIMEN QUALITY 3963: ADEQUATE

## 2017-03-18 MED ORDER — BREO ELLIPTA 200-25 MCG/INH IN AEPB: 1.0000 | INHALATION_SPRAY | Freq: Every day | RESPIRATORY_TRACT | 11 refills | Status: DC

## 2017-03-18 NOTE — Progress Notes (Signed)
HPI: Andrea Wheeler is a 26 y.o. 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  she presents to [facility] today, 03/18/17,  for chief complaint of:  Chief Complaint  Patient presents with  . Follow-up - needs Breo refilled    Patient states she was told by her pharmacy that they were not able to reach [place] regarding inhaler refill.  She has a few other questions about her IUD.  Has had this in place for a few years.  Reports heavy and uncomfortable periods.  Wants to know if this is normal.  Has not felt for the strings of her IUD in a while but had not been able to feel that much before then.  Overdue for Pap.  Reports some vaginal discharge as well but has been bothering   Past medical, surgical, social and family history reviewed:  Patient Active Problem List   Diagnosis Date Noted  . Moderate persistent asthma 08/13/2016  . IUD (intrauterine device) in place 08/13/2016    No past surgical history on file.  Social History   Tobacco Use  . Smoking status: Never Smoker  . Smokeless tobacco: Never Used  Substance Use Topics  . Alcohol use: No    Family History  Problem Relation Age of Onset  . Hypertension Mother      Current medication list and allergy/intolerance information reviewed:    Current Outpatient Medications  Medication Sig Dispense Refill  . albuterol (PROVENTIL HFA;VENTOLIN HFA) 108 (90 Base) MCG/ACT inhaler Inhale 1-2 puffs into the lungs every 4 (four) hours as needed for wheezing or shortness of breath. 1 Inhaler 3  . BREO ELLIPTA 200-25 MCG/INH AEPB I PUFF BY MOUTH EVERY DAY  4  . levocetirizine (XYZAL) 5 MG tablet Take 1 tablet (5 mg total) by mouth every evening. 90 tablet 3  . montelukast (SINGULAIR) 10 MG tablet Take 1 tablet (10 mg total) by mouth at bedtime. 90 tablet 3  . PARAGARD INTRAUTERINE COPPER IUD IUD 1 each by Intrauterine route. Due for  removal 2026? 1 each 0   No current facility-administered medications for this visit.     Allergies  Allergen Reactions  . Other Itching    As per patient - allergic to walnuts, causes itching in mouth & throat      Review of Systems:  Constitutional:  No  fever, no chills, No recent illness,  Cardiac: No  chest pain  Respiratory:  No  shortness of breath. No  Cough  Gastrointestinal: No  abdominal pain, No  nausea  Musculoskeletal: No new myalgia/arthralgia  Genitourinary: No  incontinence, No  abnormal genital bleeding, +abnormal genital discharge  Skin: No  Rash  Neurologic: No  weakness, No  dizziness  Psychiatric: No  concerns with depression, No  concerns with anxiety  Exam:  BP 132/69   Pulse 97   Wt 177 lb (80.3 kg)   BMI 32.37 kg/m   Constitutional: VS see above. General Appearance: alert, well-developed, well-nourished, NAD  Eyes: Normal lids and conjunctive, non-icteric sclera  Ears, Nose, Mouth, Throat: MMM, Normal external inspection ears/nares/mouth/lips/gums.  Neck: No masses, trachea midline.  Respiratory: Normal respiratory effort.   Cardiovascular: S1/S2 normal, no murmur, no rub/gallop auscultated.  Musculoskeletal: Gait normal.   Neurological: Normal balance/coordination. No tremor  Skin: warm, dry, intact. No rash/ulcer. Cyst L posterior thigh  Psychiatric: Normal judgment/insight. Normal mood and affect. Oriented x3.  GYN: No  lesions/ulcers to external genitalia, normal urethra, normal vaginal mucosa, physiologic discharge, cervix normal without lesions, uterus not enlarged or tender, adnexa no masses and nontender. IUD strings visible      ASSESSMENT/PLAN:   IUD check up - strings visible, not to worry  Routine screening for STI (sexually transmitted infection) - Plan: WET PREP FOR [place], YEAST, CLUE, C. trachomatis/N. gonorrhoeae RNA  Moderate persistent asthma, unspecified whether complicated - Breo refilled - Plan: BREO  ELLIPTA 200-25 MCG/INH AEPB  Cervical cancer screening - Plan: Cytology - PAP  Vaginal discharge - Plan: WET PREP FOR [place], YEAST, CLUE, C. trachomatis/N. gonorrhoeae RNA  Cyst of skin and subcutaneous tissue      Visit summary with medication list and pertinent instructions was printed for patient to review. All questions at time of visit were answered - patient instructed to contact office with any additional concerns. ER/RTC precautions were reviewed with the patient.   Follow-up plan: Return in about 6 months (around 09/16/2017) for annual physical, sooner if needed and next few weeks for cyst removal .  Note: Total time spent 25 minutes, greater than 50% of the visit was spent face-to-face counseling and coordinating care for the following: The primary encounter diagnosis was IUD check up. Diagnoses of Routine screening for STI (sexually transmitted infection), Moderate persistent asthma, unspecified whether complicated, Cervical cancer screening, and Vaginal discharge were also pertinent to this visit.Marland Kitchen  Please note: voice recognition software was used to produce this document, and typos may escape review. Please contact Dr. Sheppard Coil for any needed clarifications.

## 2017-03-19 LAB — C. TRACHOMATIS/N. GONORRHOEAE RNA
C. TRACHOMATIS RNA, TMA: NOT DETECTED
N. GONORRHOEAE RNA, TMA: NOT DETECTED

## 2017-03-20 LAB — CYTOLOGY - PAP: Diagnosis: NEGATIVE

## 2017-04-03 ENCOUNTER — Encounter: Payer: Self-pay | Admitting: Osteopathic Medicine

## 2017-04-03 ENCOUNTER — Ambulatory Visit: Payer: 59 | Admitting: Osteopathic Medicine

## 2017-04-03 VITALS — BP 118/76 | HR 90 | Temp 98.3°F | Wt 175.5 lb

## 2017-04-03 DIAGNOSIS — D1724 Benign lipomatous neoplasm of skin and subcutaneous tissue of left leg: Secondary | ICD-10-CM | POA: Diagnosis not present

## 2017-04-03 NOTE — Patient Instructions (Signed)
Plan:  Keep sutured area covered with bandage, do not get wet for 24 hours, but after than can shower as normal, keep clean with warm water and mild soap   Avoid sitting on hard surfaces, stretching the leg/buttock  Can use neosporin or vaseline   If redness, pain, concern for infection - come see me  Can take Ibuprofen 800 mg (4 of the 200 mg OTC pills) every 6-8 hours as needed for pain

## 2017-04-03 NOTE — Progress Notes (Signed)
HPI: Andrea Wheeler is a 27 y.o. 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  she presents to [facility] today, 04/03/17,  for chief complaint of: skin abnormality removal    Bothersome lump on posterior L thigh has bee there several years.   Past medical, surgical, social and family history reviewed:  Patient Active Problem List   Diagnosis Date Noted  . Cyst of skin and subcutaneous tissue 03/18/2017  . Moderate persistent asthma 08/13/2016  . IUD (intrauterine device) in place 08/13/2016    No past surgical history on file.  Social History   Tobacco Use  . Smoking status: Never Smoker  . Smokeless tobacco: Never Used  Substance Use Topics  . Alcohol use: No    Family History  Problem Relation Age of Onset  . Hypertension Mother      Current medication list and allergy/intolerance information reviewed:    Current Outpatient Medications  Medication Sig Dispense Refill  . albuterol (PROVENTIL HFA;VENTOLIN HFA) 108 (90 Base) MCG/ACT inhaler Inhale 1-2 puffs into the lungs every 4 (four) hours as needed for wheezing or shortness of breath. 1 Inhaler 3  . BREO ELLIPTA 200-25 MCG/INH AEPB Inhale 1 puff into the lungs daily. 60 each 11  . levocetirizine (XYZAL) 5 MG tablet Take 1 tablet (5 mg total) by mouth every evening. 90 tablet 3  . montelukast (SINGULAIR) 10 MG tablet Take 1 tablet (10 mg total) by mouth at bedtime. 90 tablet 3  . PARAGARD INTRAUTERINE COPPER IUD IUD 1 each by Intrauterine route. Due for removal 2026? 1 each 0   No current facility-administered medications for this visit.     Allergies  Allergen Reactions  . Other Itching    As per patient - allergic to walnuts, causes itching in mouth & throat      Review of Systems:  Skin: No  Rash, +other wounds/concerning lesions   Exam:  BP 118/76   Pulse 90   Temp 98.3 F (36.8 C) (Oral)   Wt 175  lb 8 oz (79.6 kg)   BMI 32.10 kg/m   Constitutional: VS see above. General Appearance: alert, well-developed, well-nourished, NAD  Musculoskeletal: Gait normal.   Skin: warm, dry, intact. No rash/ulcer. (+)nodule on L posterior thigh   Psychiatric: Normal judgment/insight. Normal mood and affect. Oriented x3.    PRE-OP DIAGNOSIS: Abnormal Skin Lesion POST-OP DIAGNOSIS: Same  PROCEDURE: skin biopsy Performing Physician: Emeterio Reeve   PROCEDURE:  Excisional Biopsy    The area surrounding the skin lesion was prepared and draped in the usual sterile manner. The lesion was removed in the usual manner using ellipsis surrouding the lesion, apparent lipoma was removed without difficulty. Hemostasis was assured. The patient tolerated the procedure well.   Closure: 0 prolene suture simple interrupted   Followup: The patient tolerated the procedure well without complications.  Standard post-procedure care is explained and return precautions are given.   ASSESSMENT/PLAN: Initially considered cyst but on excision more like lipoma, will send for pathology, RTC as directed for suture removal or as needed  Lipoma of left lower extremity - Plan: Dermatology pathology    Patient Instructions  Plan:  Keep sutured area covered with bandage, do not get wet for 24 hours, but after than can shower as normal, keep clean with warm water and mild soap   Avoid sitting on hard surfaces, stretching the leg/buttock  Can use neosporin or vaseline  If redness, pain, concern for infection - come see me  Can take Ibuprofen 800 mg (4 of the 200 mg OTC pills) every 6-8 hours as needed for pain     Visit summary with medication list and pertinent instructions was printed for patient to review. All questions at time of visit were answered - patient instructed to contact office with any additional concerns. ER/RTC precautions were reviewed with the patient.   Follow-up plan: Return for remove  sutures 10-14 days, sooner if needed.  Note: Total time spent 25 minutes, greater than 50% of the visit was spent face-to-face counseling and coordinating care for the following: The encounter diagnosis was Lipoma of left lower extremity..  Please note: voice recognition software was used to produce this document, and typos may escape review. Please contact Dr. Sheppard Coil for any needed clarifications.

## 2017-04-12 ENCOUNTER — Encounter: Payer: Self-pay | Admitting: Osteopathic Medicine

## 2017-04-12 ENCOUNTER — Ambulatory Visit (INDEPENDENT_AMBULATORY_CARE_PROVIDER_SITE_OTHER): Payer: 59 | Admitting: Osteopathic Medicine

## 2017-04-12 VITALS — BP 118/79 | HR 83 | Temp 98.0°F | Wt 173.0 lb

## 2017-04-12 DIAGNOSIS — D1724 Benign lipomatous neoplasm of skin and subcutaneous tissue of left leg: Secondary | ICD-10-CM

## 2017-04-12 DIAGNOSIS — Z4802 Encounter for removal of sutures: Secondary | ICD-10-CM | POA: Diagnosis not present

## 2017-04-12 NOTE — Progress Notes (Signed)
HPI: Andrea Wheeler is a 27 y.o. 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  she presents to [facility] today, 04/12/17,  for chief complaint of:  Chief Complaint  Patient presents with  . Suture / Staple Removal    Recently excised lesion on left posterior thigh. Patient states healing well, no redness or drainage. Here today for suture removal.  Past medical, surgical, social and family history reviewed: No changes needed  Current medication list and allergy/intolerance information reviewed:  No changes needed      Review of Systems:  Constitutional:  No  fever, no chills, No recent illness,  Skin: No  Rash, No other wounds/concerning lesions except excision site   Exam:  BP 118/79   Pulse 83   Temp 98 F (36.7 C) (Oral)   Wt 173 lb (78.5 kg)   BMI 31.64 kg/m   Constitutional: VS see above. General Appearance: alert, well-developed, well-nourished, NAD  Respiratory: Normal respiratory effort.  Musculoskeletal: Gait normal.  Skin: warm, dry, no erythema or drainage. 6 sutures removed without difficulty from biopsy site, skin is fairly well-healed except at one of the edges so we applied some Steri-Strips for extra coverage until totally healed   Psychiatric: Normal judgment/insight. Normal mood and affect. Oriented x3.     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 All questions at time of visit were answered - patient instructed to contact office with any additional concerns. ER/RTC precautions were reviewed with the patient.   Follow-up plan: Return for Recheck as needed, otherwise as scheduled for routine care.    Please note: voice recognition software  was used to produce this document, and typos may escape review. Please contact Dr. Sheppard Coil for any needed clarifications.

## 2017-09-16 ENCOUNTER — Other Ambulatory Visit: Payer: Self-pay | Admitting: Osteopathic Medicine

## 2017-09-16 DIAGNOSIS — J4540 Moderate persistent asthma, uncomplicated: Secondary | ICD-10-CM

## 2017-10-16 ENCOUNTER — Emergency Department
Admission: EM | Admit: 2017-10-16 | Discharge: 2017-10-16 | Disposition: A | Discharge status: Home / Self Care | Payer: 59 | Source: Home / Self Care | Attending: Family Medicine | Admitting: Family Medicine

## 2017-10-16 ENCOUNTER — Encounter: Payer: Self-pay | Admitting: *Deleted

## 2017-10-16 ENCOUNTER — Other Ambulatory Visit: Payer: Self-pay

## 2017-10-16 DIAGNOSIS — S61258A Open bite of other finger without damage to nail, initial encounter: Secondary | ICD-10-CM | POA: Diagnosis not present

## 2017-10-16 DIAGNOSIS — Z23 Encounter for immunization: Secondary | ICD-10-CM | POA: Diagnosis not present

## 2017-10-16 DIAGNOSIS — W540XXA Bitten by dog, initial encounter: Secondary | ICD-10-CM

## 2017-10-16 MED ORDER — TETANUS-DIPHTH-ACELL PERTUSSIS 5-2.5-18.5 LF-MCG/0.5 IM SUSP
0.5000 mL | Freq: Once | INTRAMUSCULAR | Status: AC
  Administered 2017-10-16: 0.5 mL via INTRAMUSCULAR

## 2017-10-16 MED ORDER — AMOXICILLIN-POT CLAVULANATE 875-125 MG PO TABS: 1.0000 | ORAL_TABLET | Freq: Two times a day (BID) | ORAL | 0 refills | Status: DC

## 2017-10-16 NOTE — ED Provider Notes (Signed)
[name]    CSN: [id number] Arrival date & time: 10/16/17  1350     History   Chief Complaint Chief Complaint  Patient presents with  . Animal Bite    HPI Andrea Wheeler is a 27 y.o. female.   HPI  Andrea Wheeler is a 27 y.o. female presenting to UC with c/o dog bite to her Left index finger that occurred around 12:30PM today. 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 Bleeding controlled PTA. Pain is aching and sore, moderate in severity, ibuprofen taken at 1PM. Rabies vaccine is UTD. Pt is uncertain of her last tetanus.    Past Medical History:  Diagnosis Date  . 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 Asthma   . Asthma due to seasonal allergies     Patient Active Problem List   Diagnosis Date Noted  . Lipoma of left lower extremity 04/12/2017  . Cyst of skin and subcutaneous tissue 03/18/2017  . Moderate persistent asthma 08/13/2016  . IUD (intrauterine device) in place 08/13/2016    History reviewed. No pertinent surgical history.  OB History   None      Home Medications    Prior to Admission medications   Medication Sig Start Date End Date Taking? Authorizing Provider  BREO ELLIPTA 200-25 MCG/INH AEPB Inhale 1 puff into the lungs daily. 03/18/17  Yes Emeterio Reeve, DO  loratadine (CLARITIN) 10 MG tablet Take 10 mg by mouth daily.   Yes [provider]  montelukast (SINGULAIR) 10 MG tablet TAKE 1 TABLET BY MOUTH EVERYDAY AT BEDTIME 09/16/17  Yes Emeterio Reeve, DO  albuterol (PROVENTIL HFA;VENTOLIN HFA) 108 (90 Base) MCG/ACT inhaler Inhale 1-2 puffs into the lungs every 4 (four) hours as needed for wheezing or shortness of breath. 08/13/16   Emeterio Reeve, DO  amoxicillin-clavulanate (AUGMENTIN) 875-125 MG tablet Take 1 tablet by mouth 2 (two) times daily. One po bid x 7 days 10/16/17   Noe Gens, PA-C  levocetirizine (XYZAL) 5 MG tablet Take 1 tablet (5 mg total) by mouth every evening. 08/13/16   Emeterio Reeve, DO    PARAGARD INTRAUTERINE COPPER IUD IUD 1 each by Intrauterine route. Due for removal 2026? 08/13/16   Emeterio Reeve, DO    Family History Family History  Problem Relation Age of Onset  . Hypertension Mother     Social History Social History   Tobacco Use  . Smoking status: Never Smoker  . Smokeless tobacco: Never Used  Substance Use Topics  . Alcohol use: No  . Drug use: Never     Allergies   Other   Review of Systems Review of Systems  Musculoskeletal: Positive for arthralgias and joint swelling.  Skin: Positive for wound.  Neurological: Negative for weakness and numbness.     Physical Exam Triage Vital Signs ED Triage Vitals  Enc Vitals Group     BP 10/16/17 1422 135/84     Pulse Rate 10/16/17 1422 93     Resp 10/16/17 1422 18     Temp 10/16/17 1422 97.8 F (36.6 C)     Temp Source 10/16/17 1422 Oral     SpO2 10/16/17 1422 100 %     Weight 10/16/17 1423 170 lb (77.1 kg)     Height 10/16/17 1423 5\' 2"  (1.575 m)     Head Circumference --      Peak Flow --      Pain Score 10/16/17 1422 8     Pain Loc --  Pain Edu? --      Excl. in [place]? --    No data found.  Updated Vital Signs BP 135/84 (BP Location: Right Arm)   Pulse 93   Temp 97.8 F (36.6 C) (Oral)   Resp 18   Ht 5\' 2"  (1.575 m)   Wt 170 lb (77.1 kg)   LMP 10/09/2017   SpO2 100%   BMI 31.09 kg/m   Visual Acuity Right Eye Distance:   Left Eye Distance:   Bilateral Distance:    Right Eye Near:   Left Eye Near:    Bilateral Near:     Physical Exam  Constitutional: She is oriented to person, place, and time. She appears well-developed and well-nourished.  HENT:  Head: Normocephalic and atraumatic.  Eyes: EOM are normal.  Neck: Normal range of motion.  Cardiovascular: Normal rate.  Pulmonary/Chest: Effort normal.  Musculoskeletal: Normal range of motion. She exhibits edema and tenderness.       Left hand: She exhibits tenderness and laceration.       Hands: Left index  finger: mild edema to distal aspect. Tender. Full ROM w/o crepitus. Superficial laceration over dorsal aspect, radial side of nail. It does involve the nailbed but not the nail matrix.   Neurological: She is alert and oriented to person, place, and time.  Skin: Skin is warm and dry.  Psychiatric: She has a normal mood and affect. Her behavior is normal.  Nursing note and vitals reviewed.    UC Treatments / Results  Labs (all labs ordered are listed, but only abnormal results are displayed) Labs Reviewed - No data to display  EKG None  Radiology No results found.  Procedures Procedures (including critical care time)  Medications Ordered in UC Medications  Tdap (BOOSTRIX) injection 0.5 mL (0.5 mLs Intramuscular Given 10/16/17 1430)    Initial Impression / Assessment and Plan / UC Course  I have reviewed the triage vital signs and the nursing notes.  Pertinent labs & imaging results that were available during my care of the patient were reviewed by me and considered in my medical decision making (see chart for details).     Dog bite to Left index finger. Full ROM Tenderness to distal aspect. Some concern for possible underlying fracture, however, due to cost concerns, pt would like to hold off on x-ray at this time. Will tx as possible fracture. Pt will be on Augmentin regardless due to dog bite. Wound soaked in warm water and Hibiclens. Pat dried and bandage applied. Finger splint applied. Tdap updated.  Final Clinical Impressions(s) / UC Diagnoses   Final diagnoses:  Dog bite of index finger, initial encounter     Discharge Instructions      Try to keep the wound clean with warm water and mild soap. Change the bandage twice daily, sooner if it gets wet or dirty.    You may also wear the finger splint for 1-2 weeks until the soreness resolves. If pain continues to worsen, you have worsening redness, swelling, drainage or pus or develop a fever, please be  re-evaluated by a medical provider immediately as these are signs of an infection.  You may need an x-ray at that time to ensure no bone infection and/or a change in antibiotics.     ED Prescriptions    Medication Sig Dispense Auth. Provider   amoxicillin-clavulanate (AUGMENTIN) 875-125 MG tablet Take 1 tablet by mouth 2 (two) times daily. One po bid x 7 days 14 tablet Leeroy Cha  O, PA-C     Controlled Substance Prescriptions [place] Controlled Substance Registry consulted? Not Applicable   Tyrell Antonio 10/16/17 1732

## 2017-10-16 NOTE — Discharge Instructions (Signed)
°  Try to keep the wound clean with warm water and mild soap. Change the bandage twice daily, sooner if it gets wet or dirty.    You may also wear the finger splint for 1-2 weeks until the soreness resolves. If pain continues to worsen, you have worsening redness, swelling, drainage or pus or develop a fever, please be re-evaluated by a medical provider immediately as these are signs of an infection.  You may need an x-ray at that time to ensure no bone infection and/or a change in antibiotics.

## 2017-10-16 NOTE — ED Triage Notes (Signed)
Pt c/o dog bite to her LT 2nd finger x 1230. Took IBF 400mg  at 1300. Presented dogs rabies vaccines (10/29/16).

## 2017-10-19 ENCOUNTER — Encounter: Payer: Self-pay | Admitting: Osteopathic Medicine

## 2017-10-22 ENCOUNTER — Encounter: Payer: Self-pay | Admitting: Osteopathic Medicine

## 2017-10-24 ENCOUNTER — Other Ambulatory Visit: Payer: Self-pay

## 2017-10-24 ENCOUNTER — Emergency Department (INDEPENDENT_AMBULATORY_CARE_PROVIDER_SITE_OTHER)
Admission: EM | Admit: 2017-10-24 | Discharge: 2017-10-24 | Disposition: A | Discharge status: Home / Self Care | Payer: Self-pay | Source: Home / Self Care | Attending: Emergency Medicine | Admitting: Emergency Medicine

## 2017-10-24 DIAGNOSIS — Z5189 Encounter for other specified aftercare: Secondary | ICD-10-CM

## 2017-10-24 NOTE — ED Triage Notes (Signed)
Pt was seen here on 7/17 with a dog bite to the left index finger.  Here for follow up

## 2017-10-24 NOTE — ED Provider Notes (Signed)
[facility]    CSN: [id number] Arrival date & time: 10/24/17  0813     History   Chief Complaint Chief Complaint  Patient presents with  . Wound Check    HPI Andrea Wheeler is a 27 y.o. female.   HPI Here for recheck of dog bite wound left index finger, seen here initially for this 7/17.  She has completed 7 days of Augmentin as prescribed.  She has been cleaning wound daily and reapplying finger splint.  she feels left index finger is much improved.  No pain at rest.  Occasional discomfort, 1 out of 10, if she bumps the tip of her left index finger.  No redness or swelling or bleeding or discharge.  No fever chills or nausea or vomiting. Past Medical History:  Diagnosis Date  . 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 Asthma   . Asthma due to seasonal allergies     Patient Active Problem List   Diagnosis Date Noted  . Lipoma of left lower extremity 04/12/2017  . Cyst of skin and subcutaneous tissue 03/18/2017  . Moderate persistent asthma 08/13/2016  . IUD (intrauterine device) in place 08/13/2016    History reviewed. No pertinent surgical history.  OB History   None      Home Medications    Prior to Admission medications   Medication Sig Start Date End Date Taking? Authorizing Provider  albuterol (PROVENTIL HFA;VENTOLIN HFA) 108 (90 Base) MCG/ACT inhaler Inhale 1-2 puffs into the lungs every 4 (four) hours as needed for wheezing or shortness of breath. 08/13/16   Emeterio Reeve, DO  amoxicillin-clavulanate (AUGMENTIN) 875-125 MG tablet Take 1 tablet by mouth 2 (two) times daily. One po bid x 7 days 10/16/17   Noe Gens, PA-C  BREO ELLIPTA 200-25 MCG/INH AEPB Inhale 1 puff into the lungs daily. 03/18/17   Emeterio Reeve, DO  levocetirizine (XYZAL) 5 MG tablet Take 1 tablet (5 mg total) by mouth every evening. 08/13/16   Emeterio Reeve, DO  loratadine (CLARITIN) 10 MG tablet Take 10 mg by mouth daily.    [provider]  montelukast (SINGULAIR)  10 MG tablet TAKE 1 TABLET BY MOUTH EVERYDAY AT BEDTIME 09/16/17   Emeterio Reeve, DO  [facility] INTRAUTERINE COPPER IUD IUD 1 each by Intrauterine route. Due for removal 2026? 08/13/16   Emeterio Reeve, DO    Family History Family History  Problem Relation Age of Onset  . Hypertension Mother     Social History Social History   Tobacco Use  . Smoking status: Never Smoker  . Smokeless tobacco: Never Used  Substance Use Topics  . Alcohol use: No  . Drug use: Never     Allergies   Other   Review of Systems Review of Systems   Physical Exam Triage Vital Signs ED Triage Vitals  Enc Vitals Group     BP 10/24/17 0827 117/76     Pulse Rate 10/24/17 0827 74     Resp --      Temp 10/24/17 0827 98.4 F (36.9 C)     Temp Source 10/24/17 0827 Oral     SpO2 10/24/17 0827 98 %     Weight 10/24/17 0828 173 lb (78.5 kg)     Height 10/24/17 0828 5\' 2"  (1.575 m)     Head Circumference --      Peak Flow --      Pain Score 10/24/17 0828 0     Pain Loc --  Pain Edu? --      Excl. in [place]? --    No data found.  Updated Vital Signs BP 117/76 (BP Location: Right Arm)   Pulse 74   Temp 98.4 F (36.9 C) (Oral)   Ht 5\' 2"  (1.575 m)   Wt 173 lb (78.5 kg)   LMP 10/09/2017   SpO2 98%   BMI 31.64 kg/m   Visual Acuity Right Eye Distance:   Left Eye Distance:   Bilateral Distance:    Right Eye Near:   Left Eye Near:    Bilateral Near:     Physical Exam No distress.. Left index finger: Finger nail intact.  No ecchymosis.  Nontender.  No swelling or redness or drainage.  The wound has granulated in nicely.  Sensation and capillary refill intact.  Function, left index finger normal.  Extension and flexion normal.  Tendons intact.  UC Treatments / Results  Labs (all labs ordered are listed, but only abnormal results are displayed) Labs Reviewed - No data to display  EKG None  Radiology No results found.  Procedures Procedures (including critical care  time)  Medications Ordered in UC Medications - No data to display  Initial Impression / Assessment and Plan / UC Course  I have reviewed the triage vital signs and the nursing notes.  Pertinent labs & imaging results that were available during my care of the patient were reviewed by me and considered in my medical decision making (see chart for details).    8 days status post dog bite left index finger.  Has healed up nicely without signs of infection.  No bony tenderness.  No sign of fracture.  Function normal.  Today, wound cleaned and redressed.  Advised that she does not need to wear a finger splint at this point unless she wants extra protection at times over the next few days. Red flags and other precautions discussed. Recheck here or with PCP as needed. Final Clinical Impressions(s) / UC Diagnoses   Final diagnoses:  Visit for wound check   Discharge Instructions   None    ED Prescriptions    None     Controlled Substance Prescriptions [place] Controlled Substance Registry consulted? Not Applicable   Jacqulyn Cane, MD 10/24/17 [phone]

## 2017-11-04 ENCOUNTER — Ambulatory Visit: Payer: 59 | Admitting: Osteopathic Medicine

## 2017-11-05 ENCOUNTER — Encounter: Payer: Self-pay | Admitting: Osteopathic Medicine

## 2017-11-06 ENCOUNTER — Telehealth: Payer: Self-pay

## 2017-11-06 NOTE — Telephone Encounter (Signed)
Pt called requesting lab work or immunization records for college. Pt has no records under maiden name noted in [place]. Pls advise, thanks.

## 2017-11-07 NOTE — Telephone Encounter (Signed)
We need to know the requirements her college has prior to ordering labs then. She needs to bring [place] the paperwork so we can determine whether an appointment is needed, or she can schedule an appointment.

## 2017-11-07 NOTE — Telephone Encounter (Signed)
Left VM for Pt to contact office regarding form specs or to bring it by.

## 2017-11-18 DIAGNOSIS — J3081 Allergic rhinitis due to animal (cat) (dog) hair and dander: Secondary | ICD-10-CM | POA: Diagnosis not present

## 2017-11-18 DIAGNOSIS — J301 Allergic rhinitis due to pollen: Secondary | ICD-10-CM | POA: Diagnosis not present

## 2017-11-18 DIAGNOSIS — J3089 Other allergic rhinitis: Secondary | ICD-10-CM | POA: Diagnosis not present

## 2017-12-03 DIAGNOSIS — J3089 Other allergic rhinitis: Secondary | ICD-10-CM | POA: Diagnosis not present

## 2017-12-03 DIAGNOSIS — J3081 Allergic rhinitis due to animal (cat) (dog) hair and dander: Secondary | ICD-10-CM | POA: Diagnosis not present

## 2017-12-03 DIAGNOSIS — J301 Allergic rhinitis due to pollen: Secondary | ICD-10-CM | POA: Diagnosis not present

## 2017-12-17 DIAGNOSIS — J3081 Allergic rhinitis due to animal (cat) (dog) hair and dander: Secondary | ICD-10-CM | POA: Diagnosis not present

## 2017-12-17 DIAGNOSIS — J301 Allergic rhinitis due to pollen: Secondary | ICD-10-CM | POA: Diagnosis not present

## 2017-12-17 DIAGNOSIS — J3089 Other allergic rhinitis: Secondary | ICD-10-CM | POA: Diagnosis not present

## 2018-01-07 DIAGNOSIS — J3089 Other allergic rhinitis: Secondary | ICD-10-CM | POA: Diagnosis not present

## 2018-01-07 DIAGNOSIS — J3081 Allergic rhinitis due to animal (cat) (dog) hair and dander: Secondary | ICD-10-CM | POA: Diagnosis not present

## 2018-01-07 DIAGNOSIS — J301 Allergic rhinitis due to pollen: Secondary | ICD-10-CM | POA: Diagnosis not present

## 2018-01-20 DIAGNOSIS — J301 Allergic rhinitis due to pollen: Secondary | ICD-10-CM | POA: Diagnosis not present

## 2018-01-20 DIAGNOSIS — J3081 Allergic rhinitis due to animal (cat) (dog) hair and dander: Secondary | ICD-10-CM | POA: Diagnosis not present

## 2018-01-20 DIAGNOSIS — J3089 Other allergic rhinitis: Secondary | ICD-10-CM | POA: Diagnosis not present

## 2018-01-28 DIAGNOSIS — J301 Allergic rhinitis due to pollen: Secondary | ICD-10-CM | POA: Diagnosis not present

## 2018-01-28 DIAGNOSIS — J3089 Other allergic rhinitis: Secondary | ICD-10-CM | POA: Diagnosis not present

## 2018-01-28 DIAGNOSIS — J3081 Allergic rhinitis due to animal (cat) (dog) hair and dander: Secondary | ICD-10-CM | POA: Diagnosis not present

## 2018-02-13 DIAGNOSIS — J301 Allergic rhinitis due to pollen: Secondary | ICD-10-CM | POA: Diagnosis not present

## 2018-02-13 DIAGNOSIS — J3089 Other allergic rhinitis: Secondary | ICD-10-CM | POA: Diagnosis not present

## 2018-02-13 DIAGNOSIS — J3081 Allergic rhinitis due to animal (cat) (dog) hair and dander: Secondary | ICD-10-CM | POA: Diagnosis not present

## 2018-02-20 DIAGNOSIS — J3089 Other allergic rhinitis: Secondary | ICD-10-CM | POA: Diagnosis not present

## 2018-02-20 DIAGNOSIS — J3081 Allergic rhinitis due to animal (cat) (dog) hair and dander: Secondary | ICD-10-CM | POA: Diagnosis not present

## 2018-02-20 DIAGNOSIS — J301 Allergic rhinitis due to pollen: Secondary | ICD-10-CM | POA: Diagnosis not present

## 2018-02-25 DIAGNOSIS — J301 Allergic rhinitis due to pollen: Secondary | ICD-10-CM | POA: Diagnosis not present

## 2018-02-25 DIAGNOSIS — J3081 Allergic rhinitis due to animal (cat) (dog) hair and dander: Secondary | ICD-10-CM | POA: Diagnosis not present

## 2018-02-25 DIAGNOSIS — J3089 Other allergic rhinitis: Secondary | ICD-10-CM | POA: Diagnosis not present

## 2018-03-04 DIAGNOSIS — J3089 Other allergic rhinitis: Secondary | ICD-10-CM | POA: Diagnosis not present

## 2018-03-04 DIAGNOSIS — J3081 Allergic rhinitis due to animal (cat) (dog) hair and dander: Secondary | ICD-10-CM | POA: Diagnosis not present

## 2018-03-04 DIAGNOSIS — J301 Allergic rhinitis due to pollen: Secondary | ICD-10-CM | POA: Diagnosis not present

## 2018-03-19 DIAGNOSIS — J3081 Allergic rhinitis due to animal (cat) (dog) hair and dander: Secondary | ICD-10-CM | POA: Diagnosis not present

## 2018-03-19 DIAGNOSIS — J3089 Other allergic rhinitis: Secondary | ICD-10-CM | POA: Diagnosis not present

## 2018-03-19 DIAGNOSIS — J301 Allergic rhinitis due to pollen: Secondary | ICD-10-CM | POA: Diagnosis not present

## 2018-04-09 DIAGNOSIS — J301 Allergic rhinitis due to pollen: Secondary | ICD-10-CM | POA: Diagnosis not present

## 2018-04-09 DIAGNOSIS — J3081 Allergic rhinitis due to animal (cat) (dog) hair and dander: Secondary | ICD-10-CM | POA: Diagnosis not present

## 2018-04-09 DIAGNOSIS — J3089 Other allergic rhinitis: Secondary | ICD-10-CM | POA: Diagnosis not present

## 2018-04-17 DIAGNOSIS — J301 Allergic rhinitis due to pollen: Secondary | ICD-10-CM | POA: Diagnosis not present

## 2018-04-17 DIAGNOSIS — J3081 Allergic rhinitis due to animal (cat) (dog) hair and dander: Secondary | ICD-10-CM | POA: Diagnosis not present

## 2018-04-17 DIAGNOSIS — J3089 Other allergic rhinitis: Secondary | ICD-10-CM | POA: Diagnosis not present

## 2018-04-17 DIAGNOSIS — J4530 Mild persistent asthma, uncomplicated: Secondary | ICD-10-CM | POA: Diagnosis not present

## 2018-05-01 DIAGNOSIS — J301 Allergic rhinitis due to pollen: Secondary | ICD-10-CM | POA: Diagnosis not present

## 2018-05-01 DIAGNOSIS — J3089 Other allergic rhinitis: Secondary | ICD-10-CM | POA: Diagnosis not present

## 2018-05-01 DIAGNOSIS — J3081 Allergic rhinitis due to animal (cat) (dog) hair and dander: Secondary | ICD-10-CM | POA: Diagnosis not present

## 2018-05-13 DIAGNOSIS — J3089 Other allergic rhinitis: Secondary | ICD-10-CM | POA: Diagnosis not present

## 2018-05-13 DIAGNOSIS — J3081 Allergic rhinitis due to animal (cat) (dog) hair and dander: Secondary | ICD-10-CM | POA: Diagnosis not present

## 2018-05-13 DIAGNOSIS — J301 Allergic rhinitis due to pollen: Secondary | ICD-10-CM | POA: Diagnosis not present

## 2018-05-27 DIAGNOSIS — J301 Allergic rhinitis due to pollen: Secondary | ICD-10-CM | POA: Diagnosis not present

## 2018-05-27 DIAGNOSIS — J3089 Other allergic rhinitis: Secondary | ICD-10-CM | POA: Diagnosis not present

## 2018-05-27 DIAGNOSIS — J3081 Allergic rhinitis due to animal (cat) (dog) hair and dander: Secondary | ICD-10-CM | POA: Diagnosis not present

## 2018-06-08 ENCOUNTER — Other Ambulatory Visit: Payer: Self-pay | Admitting: Osteopathic Medicine

## 2018-06-08 DIAGNOSIS — J4540 Moderate persistent asthma, uncomplicated: Secondary | ICD-10-CM

## 2018-06-12 DIAGNOSIS — J301 Allergic rhinitis due to pollen: Secondary | ICD-10-CM | POA: Diagnosis not present

## 2018-06-12 DIAGNOSIS — J3081 Allergic rhinitis due to animal (cat) (dog) hair and dander: Secondary | ICD-10-CM | POA: Diagnosis not present

## 2018-06-12 DIAGNOSIS — J3089 Other allergic rhinitis: Secondary | ICD-10-CM | POA: Diagnosis not present

## 2018-07-10 DIAGNOSIS — J3081 Allergic rhinitis due to animal (cat) (dog) hair and dander: Secondary | ICD-10-CM | POA: Diagnosis not present

## 2018-07-10 DIAGNOSIS — J3089 Other allergic rhinitis: Secondary | ICD-10-CM | POA: Diagnosis not present

## 2018-07-10 DIAGNOSIS — J301 Allergic rhinitis due to pollen: Secondary | ICD-10-CM | POA: Diagnosis not present

## 2018-08-07 DIAGNOSIS — J3089 Other allergic rhinitis: Secondary | ICD-10-CM | POA: Diagnosis not present

## 2018-08-07 DIAGNOSIS — J3081 Allergic rhinitis due to animal (cat) (dog) hair and dander: Secondary | ICD-10-CM | POA: Diagnosis not present

## 2018-08-07 DIAGNOSIS — J301 Allergic rhinitis due to pollen: Secondary | ICD-10-CM | POA: Diagnosis not present

## 2018-08-22 ENCOUNTER — Emergency Department (INDEPENDENT_AMBULATORY_CARE_PROVIDER_SITE_OTHER)
Admission: EM | Admit: 2018-08-22 | Discharge: 2018-08-22 | Disposition: A | Discharge status: Home / Self Care | Payer: BLUE CROSS/BLUE SHIELD | Source: Home / Self Care | Attending: Family Medicine | Admitting: Family Medicine

## 2018-08-22 ENCOUNTER — Other Ambulatory Visit: Payer: Self-pay

## 2018-08-22 DIAGNOSIS — R8290 Unspecified abnormal findings in urine: Secondary | ICD-10-CM | POA: Diagnosis not present

## 2018-08-22 DIAGNOSIS — N3000 Acute cystitis without hematuria: Secondary | ICD-10-CM

## 2018-08-22 LAB — POCT URINALYSIS DIP (MANUAL ENTRY)
Bilirubin, UA: NEGATIVE
Glucose, UA: NEGATIVE mg/dL
Ketones, POC UA: NEGATIVE mg/dL
Leukocytes, UA: NEGATIVE
Nitrite, UA: NEGATIVE
Protein Ur, POC: NEGATIVE mg/dL
Spec Grav, UA: 1.025 (ref 1.010–1.025)
Urobilinogen, UA: 0.2 E.U./dL
pH, UA: 6 (ref 5.0–8.0)

## 2018-08-22 MED ORDER — NITROFURANTOIN MONOHYD MACRO 100 MG PO CAPS: 100.0000 mg | ORAL_CAPSULE | Freq: Two times a day (BID) | ORAL | 0 refills | Status: DC

## 2018-08-22 NOTE — ED Provider Notes (Signed)
[name]    CSN: [id number] Arrival date & time: 08/22/18  8182     History   Chief Complaint Chief Complaint  Patient presents with  . Urinary Frequency    HPI Andrea Wheeler is a 28 y.o. female.   One week ago patient developed mild urinary hesitancy, frequency, and nocturia without dysuria.  She has had a lower abdominal pressure sensation without abdominal or pelvic pain.  She has had mild nausea without vomiting.  She denies fevers, chills, and sweats.  Patient's last menstrual period was 08/13/2018.   The history is provided by the patient.  Urinary Frequency  This is a new problem. Episode onset: 1 week ago. The problem occurs constantly. The problem has been gradually worsening. Pertinent negatives include no abdominal pain. Nothing aggravates the symptoms. Nothing relieves the symptoms. She has tried nothing for the symptoms.    Past Medical History:  Diagnosis Date  . 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 Asthma   . Asthma due to seasonal allergies     Patient Active Problem List   Diagnosis Date Noted  . Lipoma of left lower extremity 04/12/2017  . Cyst of skin and subcutaneous tissue 03/18/2017  . Moderate persistent asthma 08/13/2016  . IUD (intrauterine device) in place 08/13/2016    History reviewed. No pertinent surgical history.  OB History   No obstetric history on file.      Home Medications    Prior to Admission medications   Medication Sig Start Date End Date Taking? Authorizing Provider  albuterol (PROVENTIL HFA;VENTOLIN HFA) 108 (90 Base) MCG/ACT inhaler Inhale 1-2 puffs into the lungs every 4 (four) hours as needed for wheezing or shortness of breath. 08/13/16   Emeterio Reeve, DO  BREO ELLIPTA 200-25 MCG/INH AEPB TAKE 1 PUFF BY MOUTH EVERY DAY 06/09/18   Emeterio Reeve, DO  levocetirizine (XYZAL) 5 MG tablet Take 1 tablet (5 mg total) by mouth every evening. 08/13/16   Emeterio Reeve, DO  loratadine (CLARITIN) 10 MG tablet Take  10 mg by mouth daily.    [provider]  montelukast (SINGULAIR) 10 MG tablet TAKE 1 TABLET BY MOUTH EVERYDAY AT BEDTIME 09/16/17   Emeterio Reeve, DO  nitrofurantoin, macrocrystal-monohydrate, (MACROBID) 100 MG capsule Take 1 capsule (100 mg total) by mouth 2 (two) times daily. Take with food. 08/22/18   Kandra Nicolas, MD  PARAGARD INTRAUTERINE COPPER IUD IUD 1 each by Intrauterine route. Due for removal 2026? 08/13/16   Emeterio Reeve, DO    Family History Family History  Problem Relation Age of Onset  . Hypertension Mother     Social History Social History   Tobacco Use  . Smoking status: Never Smoker  . Smokeless tobacco: Never Used  Substance Use Topics  . Alcohol use: No  . Drug use: Never     Allergies   Other   Review of Systems Review of Systems  Constitutional: Negative for chills, diaphoresis, fatigue and fever.  HENT: Negative.   Eyes: Negative.   Respiratory: Negative.   Cardiovascular: Negative.   Gastrointestinal: Positive for nausea. Negative for abdominal pain, diarrhea and vomiting.  Genitourinary: Positive for frequency and urgency. Negative for flank pain, hematuria, pelvic pain, vaginal bleeding, vaginal discharge and vaginal pain.  Musculoskeletal: Negative.   Neurological: Negative.      Physical Exam Triage Vital Signs ED Triage Vitals [08/22/18 0906]  Enc Vitals Group     BP 113/76     Pulse Rate 78  Resp 18     Temp 98.4 F (36.9 C)     Temp Source Oral     SpO2 99 %     Weight 178 lb (80.7 kg)     Height 5\' 2"  (1.575 m)     Head Circumference      Peak Flow      Pain Score 0     Pain Loc      Pain Edu?      Excl. in [place]?    No data found.  Updated Vital Signs BP 113/76 (BP Location: Left Arm)   Pulse 78   Temp 98.4 F (36.9 C) (Oral)   Resp 18   Ht 5\' 2"  (1.575 m)   Wt 80.7 kg   LMP 08/13/2018   SpO2 99%   BMI 32.56 kg/m   Visual Acuity Right Eye Distance:   Left Eye Distance:   Bilateral  Distance:    Right Eye Near:   Left Eye Near:    Bilateral Near:     Physical Exam Nursing notes and Vital Signs reviewed. Appearance:  Patient appears stated age, and in no acute distress.    Eyes:  Pupils are equal, round, and reactive to light and accomodation.  Extraocular movement is intact.  Conjunctivae are not inflamed   Pharynx:  Normal; moist mucous membranes  Neck:  Supple.  No adenopathy Lungs:  Clear to auscultation.  Breath sounds are equal.  Moving air well. Heart:  Regular rate and rhythm without murmurs, rubs, or gallops.  Abdomen:  Nontender without masses or hepatosplenomegaly.  Bowel sounds are present.  No CVA or flank tenderness.  Extremities:  No edema.  Skin:  No rash present.     UC Treatments / Results  Labs (all labs ordered are listed, but only abnormal results are displayed) Labs Reviewed  POCT URINALYSIS DIP (MANUAL ENTRY) - Abnormal; Notable for the following components:      Result Value   Blood, UA trace-intact (*)    All other components within normal limits  URINE CULTURE    EKG None  Radiology No results found.  Procedures Procedures (including critical care time)  Medications Ordered in UC Medications - No data to display  Initial Impression / Assessment and Plan / UC Course  I have reviewed the triage vital signs and the nursing notes.  Pertinent labs & imaging results that were available during my care of the patient were reviewed by me and considered in my medical decision making (see chart for details).    Urine culture pending. Begin Macrobid 100mg  BID for one week. Followup with Family Doctor if not improved in one week.    Final Clinical Impressions(s) / UC Diagnoses   Final diagnoses:  Abnormal finding on urinalysis  Acute cystitis without hematuria     Discharge Instructions     Increase fluid intake. If symptoms become significantly worse during the night or over the weekend, proceed to the local emergency  room.     ED Prescriptions    Medication Sig Dispense Auth. Provider   nitrofurantoin, macrocrystal-monohydrate, (MACROBID) 100 MG capsule Take 1 capsule (100 mg total) by mouth 2 (two) times daily. Take with food. 14 capsule Kandra Nicolas, MD         Kandra Nicolas, MD 08/22/18 [phone]

## 2018-08-22 NOTE — ED Triage Notes (Signed)
Pt c/o increase in urinary frequency x 1 week. No burning but some discomfort and feeling unable to completely void herself. No OTC meds tried.

## 2018-08-22 NOTE — Discharge Instructions (Addendum)
Increase fluid intake. °If symptoms become significantly worse during the night or over the weekend, proceed to the local emergency room.  °

## 2018-08-23 LAB — URINE CULTURE
MICRO NUMBER:: 500239
SPECIMEN QUALITY:: ADEQUATE

## 2018-08-24 ENCOUNTER — Telehealth: Payer: Self-pay

## 2018-08-24 NOTE — Telephone Encounter (Signed)
Pt given neg ucx results. Can stop antibiotics. Restart if sxs return and f/u with PCP for re-evaluation.

## 2018-09-02 ENCOUNTER — Other Ambulatory Visit: Payer: Self-pay | Admitting: Osteopathic Medicine

## 2018-09-02 DIAGNOSIS — J4540 Moderate persistent asthma, uncomplicated: Secondary | ICD-10-CM

## 2018-09-09 DIAGNOSIS — J3089 Other allergic rhinitis: Secondary | ICD-10-CM | POA: Diagnosis not present

## 2018-09-09 DIAGNOSIS — J3081 Allergic rhinitis due to animal (cat) (dog) hair and dander: Secondary | ICD-10-CM | POA: Diagnosis not present

## 2018-09-09 DIAGNOSIS — J301 Allergic rhinitis due to pollen: Secondary | ICD-10-CM | POA: Diagnosis not present

## 2018-09-21 DIAGNOSIS — J301 Allergic rhinitis due to pollen: Secondary | ICD-10-CM | POA: Diagnosis not present

## 2018-09-21 DIAGNOSIS — J3081 Allergic rhinitis due to animal (cat) (dog) hair and dander: Secondary | ICD-10-CM | POA: Diagnosis not present

## 2018-09-21 DIAGNOSIS — J3089 Other allergic rhinitis: Secondary | ICD-10-CM | POA: Diagnosis not present

## 2018-09-23 DIAGNOSIS — J3081 Allergic rhinitis due to animal (cat) (dog) hair and dander: Secondary | ICD-10-CM | POA: Diagnosis not present

## 2018-09-23 DIAGNOSIS — J3089 Other allergic rhinitis: Secondary | ICD-10-CM | POA: Diagnosis not present

## 2018-09-23 DIAGNOSIS — J301 Allergic rhinitis due to pollen: Secondary | ICD-10-CM | POA: Diagnosis not present

## 2018-10-07 DIAGNOSIS — J301 Allergic rhinitis due to pollen: Secondary | ICD-10-CM | POA: Diagnosis not present

## 2018-10-07 DIAGNOSIS — J3081 Allergic rhinitis due to animal (cat) (dog) hair and dander: Secondary | ICD-10-CM | POA: Diagnosis not present

## 2018-10-07 DIAGNOSIS — J3089 Other allergic rhinitis: Secondary | ICD-10-CM | POA: Diagnosis not present

## 2018-10-21 DIAGNOSIS — J3081 Allergic rhinitis due to animal (cat) (dog) hair and dander: Secondary | ICD-10-CM | POA: Diagnosis not present

## 2018-10-21 DIAGNOSIS — J3089 Other allergic rhinitis: Secondary | ICD-10-CM | POA: Diagnosis not present

## 2018-10-21 DIAGNOSIS — J301 Allergic rhinitis due to pollen: Secondary | ICD-10-CM | POA: Diagnosis not present

## 2018-11-18 DIAGNOSIS — J3089 Other allergic rhinitis: Secondary | ICD-10-CM | POA: Diagnosis not present

## 2018-11-18 DIAGNOSIS — J3081 Allergic rhinitis due to animal (cat) (dog) hair and dander: Secondary | ICD-10-CM | POA: Diagnosis not present

## 2018-11-18 DIAGNOSIS — J301 Allergic rhinitis due to pollen: Secondary | ICD-10-CM | POA: Diagnosis not present

## 2018-12-23 DIAGNOSIS — J3081 Allergic rhinitis due to animal (cat) (dog) hair and dander: Secondary | ICD-10-CM | POA: Diagnosis not present

## 2018-12-23 DIAGNOSIS — J3089 Other allergic rhinitis: Secondary | ICD-10-CM | POA: Diagnosis not present

## 2018-12-23 DIAGNOSIS — J301 Allergic rhinitis due to pollen: Secondary | ICD-10-CM | POA: Diagnosis not present

## 2019-01-01 ENCOUNTER — Encounter: Payer: Self-pay | Admitting: Emergency Medicine

## 2019-01-01 ENCOUNTER — Other Ambulatory Visit (HOSPITAL_COMMUNITY)
Admission: RE | Admit: 2019-01-01 | Discharge: 2019-01-01 | Disposition: A | Discharge status: Home / Self Care | Payer: BC Managed Care – PPO | Source: Ambulatory Visit | Attending: Family Medicine | Admitting: Family Medicine

## 2019-01-01 ENCOUNTER — Emergency Department (INDEPENDENT_AMBULATORY_CARE_PROVIDER_SITE_OTHER)
Admission: EM | Admit: 2019-01-01 | Discharge: 2019-01-01 | Disposition: A | Discharge status: Home / Self Care | Payer: BC Managed Care – PPO | Source: Home / Self Care | Attending: Family Medicine | Admitting: Family Medicine

## 2019-01-01 ENCOUNTER — Other Ambulatory Visit: Payer: Self-pay

## 2019-01-01 DIAGNOSIS — Z299 Encounter for prophylactic measures, unspecified: Secondary | ICD-10-CM | POA: Diagnosis not present

## 2019-01-01 DIAGNOSIS — Z0189 Encounter for other specified special examinations: Secondary | ICD-10-CM

## 2019-01-01 LAB — POCT URINALYSIS DIP (MANUAL ENTRY)
Bilirubin, UA: NEGATIVE
Glucose, UA: NEGATIVE mg/dL
Ketones, POC UA: NEGATIVE mg/dL
Nitrite, UA: NEGATIVE
Spec Grav, UA: >=1.03 — AB (ref 1.010–1.025)
Urobilinogen, UA: 0.2 E.U./dL
pH, UA: 5.5 (ref 5.0–8.0)

## 2019-01-01 NOTE — ED Provider Notes (Signed)
[facility]    CSN: [id number] Arrival date & time: 01/01/19  [id number]      History   Chief Complaint Chief Complaint  Patient presents with  . Urinalysis    HPI Andrea Wheeler is a 28 y.o. female.   Patient is concerned because her husband was recently treated for symptomatic UTI, and she wishes to be checked for UTI also (as well as GC/chlamydia).  She is completely assymptomatic, and denies urinary symptoms, vaginal discharge, pelvic pain, fever, etc.  Patient's last menstrual period was 12/20/2018.   The history is provided by the patient.    Past Medical History:  Diagnosis Date  . 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 Asthma   . Asthma due to seasonal allergies     Patient Active Problem List   Diagnosis Date Noted  . Lipoma of left lower extremity 04/12/2017  . Cyst of skin and subcutaneous tissue 03/18/2017  . Moderate persistent asthma 08/13/2016  . IUD (intrauterine device) in place 08/13/2016    History reviewed. No pertinent surgical history.  OB History   No obstetric history on file.      Home Medications    Prior to Admission medications   Medication Sig Start Date End Date Taking? Authorizing Provider  albuterol (PROVENTIL HFA;VENTOLIN HFA) 108 (90 Base) MCG/ACT inhaler Inhale 1-2 puffs into the lungs every 4 (four) hours as needed for wheezing or shortness of breath. 08/13/16   Emeterio Reeve, DO  BREO ELLIPTA 200-25 MCG/INH AEPB TAKE 1 PUFF BY MOUTH EVERY DAY 06/09/18   Emeterio Reeve, DO  levocetirizine (XYZAL) 5 MG tablet Take 1 tablet (5 mg total) by mouth every evening. 08/13/16   Emeterio Reeve, DO  loratadine (CLARITIN) 10 MG tablet Take 10 mg by mouth daily.    [provider]  montelukast (SINGULAIR) 10 MG tablet TAKE 1 TABLET BY MOUTH EVERYDAY AT BEDTIME 09/02/18   Emeterio Reeve, DO  [facility] INTRAUTERINE COPPER IUD IUD 1 each by Intrauterine route. Due for removal 2026? 08/13/16   Emeterio Reeve, DO    Family  History Family History  Problem Relation Age of Onset  . Hypertension Mother     Social History Social History   Tobacco Use  . Smoking status: Never Smoker  . Smokeless tobacco: Never Used  Substance Use Topics  . Alcohol use: No  . Drug use: Never     Allergies   Other   Review of Systems Review of Systems  Constitutional: Negative for appetite change, chills, diaphoresis, fatigue and fever.  Gastrointestinal: Negative for abdominal pain.  Genitourinary: Negative for dysuria, flank pain, frequency, genital sores, hematuria, menstrual problem, pelvic pain, urgency, vaginal bleeding, vaginal discharge and vaginal pain.  All other systems reviewed and are negative.    Physical Exam Triage Vital Signs ED Triage Vitals  Enc Vitals Group     BP 01/01/19 1018 120/79     Pulse Rate 01/01/19 1018 82     Resp --      Temp 01/01/19 1018 98.4 F (36.9 C)     Temp Source 01/01/19 1018 Oral     SpO2 01/01/19 1018 100 %     Weight 01/01/19 1019 188 lb (85.3 kg)     Height 01/01/19 1019 5\' 2"  (1.575 m)     Head Circumference --      Peak Flow --      Pain Score 01/01/19 1018 0     Pain Loc --      Pain Edu? --  Excl. in [place]? --    No data found.  Updated Vital Signs BP 120/79 (BP Location: Right Arm)   Pulse 82   Temp 98.4 F (36.9 C) (Oral)   Ht 5\' 2"  (1.575 m)   Wt 85.3 kg   LMP 12/20/2018   SpO2 100%   BMI 34.39 kg/m   Visual Acuity Right Eye Distance:   Left Eye Distance:   Bilateral Distance:    Right Eye Near:   Left Eye Near:    Bilateral Near:     Physical Exam Nursing notes and Vital Signs reviewed. Appearance:  Patient appears stated age, and in no acute distress.    Eyes:  Pupils are equal, round, and reactive to light and accomodation.  Extraocular movement is intact.  Conjunctivae are not inflamed   Pharynx:  Normal; moist mucous membranes  Neck:  Supple.  No adenopathy Lungs:  Clear to auscultation.  Breath sounds are equal.  Moving  air well. Heart:  Regular rate and rhythm without murmurs, rubs, or gallops.  Abdomen:  Nontender without masses or hepatosplenomegaly.  Bowel sounds are present.  No CVA or flank tenderness.  Extremities:  No edema.  Skin:  No rash present.     UC Treatments / Results  Labs (all labs ordered are listed, but only abnormal results are displayed) Labs Reviewed  POCT URINALYSIS DIP (MANUAL ENTRY) - Abnormal; Notable for the following components:      Result Value   Clarity, UA cloudy (*)    Spec Grav, UA >=1.030 (*)    Blood, UA moderate (*)    Protein Ur, POC trace (*)    Leukocytes, UA Small (1+) (*)    All other components within normal limits  URINE CULTURE    EKG   Radiology No results found.  Procedures Procedures (including critical care time)  Medications Ordered in UC Medications - No data to display  Initial Impression / Assessment and Plan / UC Course  I have reviewed the triage vital signs and the nursing notes.  Pertinent labs & imaging results that were available during my care of the patient were reviewed by me and considered in my medical decision making (see chart for details).    Patient assymptomatic at present.  Urine culture and GC/chlamydia pending.   Final Clinical Impressions(s) / UC Diagnoses   Final diagnoses:  Preventive measure  Encounter for laboratory test     Discharge Instructions     Followup with family doctor if urinary symptoms occur    ED Prescriptions    None        Kandra Nicolas, MD 01/01/19 1055

## 2019-01-01 NOTE — Discharge Instructions (Addendum)
Followup with family doctor if urinary symptoms occur

## 2019-01-01 NOTE — ED Triage Notes (Signed)
Here for urinalyisis, husband had UTI and she wants to get checked, denies sx.

## 2019-01-02 ENCOUNTER — Other Ambulatory Visit: Payer: Self-pay | Admitting: Osteopathic Medicine

## 2019-01-02 DIAGNOSIS — J4540 Moderate persistent asthma, uncomplicated: Secondary | ICD-10-CM

## 2019-01-02 LAB — URINE CULTURE
MICRO NUMBER:: 943857
SPECIMEN QUALITY:: ADEQUATE

## 2019-01-02 LAB — GC/CHLAMYDIA PROBE AMP (~~LOC~~) NOT AT ARMC
Chlamydia: NEGATIVE
Neisseria Gonorrhea: NEGATIVE

## 2019-01-03 ENCOUNTER — Encounter (INDEPENDENT_AMBULATORY_CARE_PROVIDER_SITE_OTHER): Payer: BC Managed Care – PPO | Admitting: Osteopathic Medicine

## 2019-01-03 DIAGNOSIS — R300 Dysuria: Secondary | ICD-10-CM | POA: Diagnosis not present

## 2019-01-04 ENCOUNTER — Other Ambulatory Visit: Payer: Self-pay | Admitting: Osteopathic Medicine

## 2019-01-04 DIAGNOSIS — J4540 Moderate persistent asthma, uncomplicated: Secondary | ICD-10-CM

## 2019-01-05 ENCOUNTER — Ambulatory Visit: Payer: BC Managed Care – PPO | Admitting: Osteopathic Medicine

## 2019-01-05 NOTE — Telephone Encounter (Signed)
Requested medication (s) are due for refill today: yes  Requested medication (s) are on the active medication list: yes  Last refill:  09/02/2018  Future visit scheduled: no  Notes to clinic:  Review for refill   Requested Prescriptions  Pending Prescriptions Disp Refills   montelukast (SINGULAIR) 10 MG tablet [Pharmacy Med Name: MONTELUKAST SOD 10 MG TABLET] 90 tablet 0    Sig: TAKE 1 TABLET BY MOUTH EVERYDAY AT BEDTIME     Pulmonology:  Leukotriene Inhibitors Failed - 01/04/2019 11:11 PM      Failed - Valid encounter within last 12 months    Recent Outpatient Visits          1 year ago Encounter for removal of sutures   [facility], Lanelle Bal, DO   1 year ago IUD check up   [place], Lanelle Bal, DO   2 years ago Abrasion of left ear canal, initial encounter   [place], Lanelle Bal, DO   2 years ago Annual physical exam   [facility], [place], DO

## 2019-01-05 NOTE — Telephone Encounter (Signed)
5 mins spent  

## 2019-01-06 ENCOUNTER — Ambulatory Visit (INDEPENDENT_AMBULATORY_CARE_PROVIDER_SITE_OTHER): Payer: BC Managed Care – PPO | Admitting: Osteopathic Medicine

## 2019-01-06 ENCOUNTER — Encounter: Payer: Self-pay | Admitting: Osteopathic Medicine

## 2019-01-06 VITALS — Wt 188.0 lb

## 2019-01-06 DIAGNOSIS — J4540 Moderate persistent asthma, uncomplicated: Secondary | ICD-10-CM

## 2019-01-06 DIAGNOSIS — R300 Dysuria: Secondary | ICD-10-CM | POA: Diagnosis not present

## 2019-01-06 MED ORDER — MONTELUKAST SODIUM 10 MG PO TABS: 10.0000 mg | ORAL_TABLET | Freq: Every day | ORAL | 3 refills | Status: AC

## 2019-01-06 NOTE — Progress Notes (Signed)
Virtual Visit via Video (App used: Doximity) Note  I connected with      Sela Hilding on 01/06/19 at 2:34 PM by a telemedicine application and verified that I am speaking with the correct person using two identifiers.  Patient is at home I am in office    I discussed the limitations of evaluation and management by telemedicine and the availability of in person appointments. The patient expressed understanding and agreed to proceed.  History of Present Illness: Andrea Wheeler is a 28 y.o. female who would like to discuss recent lab results, Rx refills    Haven't seen patient since 04/2017, pt was recently seen in UC 5 days ago for concern for UTI - husband was recently treated for UTI and she wanted to get checked out. No symptoms. UCx showed mixed flora, likely contamination, no definitive UTI of >100K CFU/mL. See MyChart message   Pt had some questions about results and possible repeat test since UCx was not totally negative/normal   Otherwise doing well, currently on allergy medications but duplicating nasal sprays w/ 2 different steroids. Needs refill on [facility]     Observations/Objective: Wt 188 lb (85.3 kg)   LMP 12/20/2018   BMI 34.39 kg/m  BP Readings from Last 3 Encounters:  01/01/19 120/79  08/22/18 113/76  10/24/17 117/76   Exam: Normal Speech.  NAD  Lab and Radiology Results No results found for this or any previous visit (from the past 72 hour(s)). No results found.     Assessment and Plan: 28 y.o. female with The primary encounter diagnosis was Dysuria. A diagnosis of Moderate persistent asthma, unspecified whether complicated was also pertinent to this visit.   PDMP not reviewed this encounter. Orders Placed This Encounter  Procedures  . Urine Microscopic  . Urinalysis   Meds ordered this encounter  Medications  . montelukast (SINGULAIR) 10 MG tablet    Sig: Take 1 tablet (10 mg total) by mouth daily.    Dispense:  90 tablet   Refill:  3   There are no Patient Instructions on file for this visit.  Instructions sent via MyChart. If MyChart not available, pt was given option for info via personal e-mail w/ no guarantee of protected health info over unsecured e-mail communication, and MyChart sign-up instructions were included.   Follow Up Instructions: Return in about 1 year (around 01/06/2020) for [place] (call week prior to visit for lab orders).    I discussed the assessment and treatment plan with the patient. The patient was provided an opportunity to ask questions and all were answered. The patient agreed with the plan and demonstrated an understanding of the instructions.   The patient was advised to call back or seek an in-person evaluation if any new concerns, if symptoms worsen or if the condition fails to improve as anticipated.  15 minutes of non-face-to-face time was provided during this encounter.                      Historical information moved to improve visibility of documentation.  Past Medical History:  Diagnosis Date  . 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 Asthma   . Asthma due to seasonal allergies    No past surgical history on file. Social History   Tobacco Use  . Smoking status: Never Smoker  . Smokeless tobacco: Never Used  Substance Use Topics  . Alcohol use: No   family history includes Hypertension in her mother.  Medications: Current Outpatient  Medications  Medication Sig Dispense Refill  . albuterol (PROVENTIL HFA;VENTOLIN HFA) 108 (90 Base) MCG/ACT inhaler Inhale 1-2 puffs into the lungs every 4 (four) hours as needed for wheezing or shortness of breath. 1 Inhaler 3  . BREO ELLIPTA 200-25 MCG/INH AEPB TAKE 1 PUFF BY MOUTH EVERY DAY 60 each 3  . levocetirizine (XYZAL) 5 MG tablet Take 1 tablet (5 mg total) by mouth every evening. 90 tablet 3  . loratadine (CLARITIN) 10 MG tablet Take 10 mg by mouth daily.    . montelukast (SINGULAIR) 10 MG tablet TAKE 1 TABLET BY  MOUTH EVERYDAY AT BEDTIME 90 tablet 0  . PARAGARD INTRAUTERINE COPPER IUD IUD 1 each by Intrauterine route. Due for removal 2026? 1 each 0   No current facility-administered medications for this visit.    Allergies  Allergen Reactions  . Other Itching    As per patient - allergic to walnuts, causes itching in mouth & throat    PDMP not reviewed this encounter. No orders of the defined types were placed in this encounter.  No orders of the defined types were placed in this encounter.

## 2019-01-07 MED ORDER — BREO ELLIPTA 200-25 MCG/INH IN AEPB: 1.0000 | INHALATION_SPRAY | Freq: Every day | RESPIRATORY_TRACT | 3 refills | Status: AC

## 2019-01-07 MED ORDER — ALBUTEROL SULFATE HFA 108 (90 BASE) MCG/ACT IN AERS: 1.0000 | INHALATION_SPRAY | RESPIRATORY_TRACT | 11 refills | Status: AC | PRN

## 2019-01-08 LAB — URINALYSIS
Bilirubin Urine: NEGATIVE
Glucose, UA: NEGATIVE
Hgb urine dipstick: NEGATIVE
Ketones, ur: NEGATIVE
Leukocytes,Ua: NEGATIVE
Nitrite: NEGATIVE
Protein, ur: NEGATIVE
Specific Gravity, Urine: 1.03 (ref 1.001–1.03)
pH: 5.5 (ref 5.0–8.0)

## 2019-01-08 LAB — URINALYSIS, MICROSCOPIC ONLY
Bacteria, UA: NONE SEEN /HPF
Hyaline Cast: NONE SEEN /LPF

## 2019-01-20 DIAGNOSIS — J301 Allergic rhinitis due to pollen: Secondary | ICD-10-CM | POA: Diagnosis not present

## 2019-01-20 DIAGNOSIS — J3081 Allergic rhinitis due to animal (cat) (dog) hair and dander: Secondary | ICD-10-CM | POA: Diagnosis not present

## 2019-01-20 DIAGNOSIS — J3089 Other allergic rhinitis: Secondary | ICD-10-CM | POA: Diagnosis not present

## 2019-03-12 DIAGNOSIS — J3081 Allergic rhinitis due to animal (cat) (dog) hair and dander: Secondary | ICD-10-CM | POA: Diagnosis not present

## 2019-03-12 DIAGNOSIS — J3089 Other allergic rhinitis: Secondary | ICD-10-CM | POA: Diagnosis not present

## 2019-03-12 DIAGNOSIS — J301 Allergic rhinitis due to pollen: Secondary | ICD-10-CM | POA: Diagnosis not present

## 2019-03-19 DIAGNOSIS — J301 Allergic rhinitis due to pollen: Secondary | ICD-10-CM | POA: Diagnosis not present

## 2019-03-19 DIAGNOSIS — J3081 Allergic rhinitis due to animal (cat) (dog) hair and dander: Secondary | ICD-10-CM | POA: Diagnosis not present

## 2019-03-19 DIAGNOSIS — J3089 Other allergic rhinitis: Secondary | ICD-10-CM | POA: Diagnosis not present

## 2019-03-24 DIAGNOSIS — J3089 Other allergic rhinitis: Secondary | ICD-10-CM | POA: Diagnosis not present

## 2019-03-24 DIAGNOSIS — J3081 Allergic rhinitis due to animal (cat) (dog) hair and dander: Secondary | ICD-10-CM | POA: Diagnosis not present

## 2019-03-24 DIAGNOSIS — J301 Allergic rhinitis due to pollen: Secondary | ICD-10-CM | POA: Diagnosis not present

## 2019-04-07 DIAGNOSIS — J3081 Allergic rhinitis due to animal (cat) (dog) hair and dander: Secondary | ICD-10-CM | POA: Diagnosis not present

## 2019-04-07 DIAGNOSIS — J301 Allergic rhinitis due to pollen: Secondary | ICD-10-CM | POA: Diagnosis not present

## 2019-04-07 DIAGNOSIS — J3089 Other allergic rhinitis: Secondary | ICD-10-CM | POA: Diagnosis not present

## 2019-04-20 DIAGNOSIS — J3089 Other allergic rhinitis: Secondary | ICD-10-CM | POA: Diagnosis not present

## 2019-04-20 DIAGNOSIS — J301 Allergic rhinitis due to pollen: Secondary | ICD-10-CM | POA: Diagnosis not present

## 2019-04-20 DIAGNOSIS — J3081 Allergic rhinitis due to animal (cat) (dog) hair and dander: Secondary | ICD-10-CM | POA: Diagnosis not present

## 2019-04-21 DIAGNOSIS — J301 Allergic rhinitis due to pollen: Secondary | ICD-10-CM | POA: Diagnosis not present

## 2019-04-21 DIAGNOSIS — J3089 Other allergic rhinitis: Secondary | ICD-10-CM | POA: Diagnosis not present

## 2019-04-21 DIAGNOSIS — J3081 Allergic rhinitis due to animal (cat) (dog) hair and dander: Secondary | ICD-10-CM | POA: Diagnosis not present

## 2019-04-29 DIAGNOSIS — J301 Allergic rhinitis due to pollen: Secondary | ICD-10-CM | POA: Diagnosis not present

## 2019-04-29 DIAGNOSIS — J3081 Allergic rhinitis due to animal (cat) (dog) hair and dander: Secondary | ICD-10-CM | POA: Diagnosis not present

## 2019-04-29 DIAGNOSIS — J4530 Mild persistent asthma, uncomplicated: Secondary | ICD-10-CM | POA: Diagnosis not present

## 2019-04-29 DIAGNOSIS — J3089 Other allergic rhinitis: Secondary | ICD-10-CM | POA: Diagnosis not present

## 2019-05-07 DIAGNOSIS — J3081 Allergic rhinitis due to animal (cat) (dog) hair and dander: Secondary | ICD-10-CM | POA: Diagnosis not present

## 2019-05-07 DIAGNOSIS — J301 Allergic rhinitis due to pollen: Secondary | ICD-10-CM | POA: Diagnosis not present

## 2019-05-07 DIAGNOSIS — J3089 Other allergic rhinitis: Secondary | ICD-10-CM | POA: Diagnosis not present

## 2019-06-11 DIAGNOSIS — J3081 Allergic rhinitis due to animal (cat) (dog) hair and dander: Secondary | ICD-10-CM | POA: Diagnosis not present

## 2019-06-11 DIAGNOSIS — J3089 Other allergic rhinitis: Secondary | ICD-10-CM | POA: Diagnosis not present

## 2019-06-11 DIAGNOSIS — J301 Allergic rhinitis due to pollen: Secondary | ICD-10-CM | POA: Diagnosis not present

## 2019-07-09 DIAGNOSIS — J3081 Allergic rhinitis due to animal (cat) (dog) hair and dander: Secondary | ICD-10-CM | POA: Diagnosis not present

## 2019-07-09 DIAGNOSIS — J301 Allergic rhinitis due to pollen: Secondary | ICD-10-CM | POA: Diagnosis not present

## 2019-07-09 DIAGNOSIS — J3089 Other allergic rhinitis: Secondary | ICD-10-CM | POA: Diagnosis not present

## 2019-08-07 DIAGNOSIS — Z03818 Encounter for observation for suspected exposure to other biological agents ruled out: Secondary | ICD-10-CM | POA: Diagnosis not present

## 2019-08-07 DIAGNOSIS — Z20828 Contact with and (suspected) exposure to other viral communicable diseases: Secondary | ICD-10-CM | POA: Diagnosis not present

## 2019-08-13 DIAGNOSIS — J3089 Other allergic rhinitis: Secondary | ICD-10-CM | POA: Diagnosis not present

## 2019-08-13 DIAGNOSIS — J301 Allergic rhinitis due to pollen: Secondary | ICD-10-CM | POA: Diagnosis not present

## 2019-08-13 DIAGNOSIS — J3081 Allergic rhinitis due to animal (cat) (dog) hair and dander: Secondary | ICD-10-CM | POA: Diagnosis not present

## 2019-09-10 DIAGNOSIS — J3081 Allergic rhinitis due to animal (cat) (dog) hair and dander: Secondary | ICD-10-CM | POA: Diagnosis not present

## 2019-09-10 DIAGNOSIS — J301 Allergic rhinitis due to pollen: Secondary | ICD-10-CM | POA: Diagnosis not present

## 2019-09-10 DIAGNOSIS — J3089 Other allergic rhinitis: Secondary | ICD-10-CM | POA: Diagnosis not present

## 2019-10-08 DIAGNOSIS — J3089 Other allergic rhinitis: Secondary | ICD-10-CM | POA: Diagnosis not present

## 2019-10-08 DIAGNOSIS — J301 Allergic rhinitis due to pollen: Secondary | ICD-10-CM | POA: Diagnosis not present

## 2019-10-08 DIAGNOSIS — J3081 Allergic rhinitis due to animal (cat) (dog) hair and dander: Secondary | ICD-10-CM | POA: Diagnosis not present

## 2019-10-28 ENCOUNTER — Ambulatory Visit (INDEPENDENT_AMBULATORY_CARE_PROVIDER_SITE_OTHER): Payer: BC Managed Care – PPO | Admitting: Osteopathic Medicine

## 2019-10-28 ENCOUNTER — Encounter: Payer: Self-pay | Admitting: Osteopathic Medicine

## 2019-10-28 ENCOUNTER — Other Ambulatory Visit (HOSPITAL_COMMUNITY)
Admission: RE | Admit: 2019-10-28 | Discharge: 2019-10-28 | Disposition: A | Discharge status: Home / Self Care | Payer: BC Managed Care – PPO | Source: Ambulatory Visit | Attending: Osteopathic Medicine | Admitting: Osteopathic Medicine

## 2019-10-28 VITALS — BP 122/84 | HR 88 | Wt 171.0 lb

## 2019-10-28 DIAGNOSIS — R102 Pelvic and perineal pain: Secondary | ICD-10-CM

## 2019-10-28 DIAGNOSIS — Z124 Encounter for screening for malignant neoplasm of cervix: Secondary | ICD-10-CM | POA: Insufficient documentation

## 2019-10-28 DIAGNOSIS — R300 Dysuria: Secondary | ICD-10-CM | POA: Diagnosis not present

## 2019-10-28 LAB — POCT URINALYSIS DIPSTICK
Bilirubin, UA: NEGATIVE
Glucose, UA: NEGATIVE
Ketones, UA: NEGATIVE
Leukocytes, UA: NEGATIVE
Nitrite, UA: NEGATIVE
Protein, UA: NEGATIVE
Spec Grav, UA: >=1.03 — AB (ref 1.010–1.025)
Urobilinogen, UA: 0.2 E.U./dL
pH, UA: 6.5 (ref 5.0–8.0)

## 2019-10-28 LAB — POCT URINE PREGNANCY: Preg Test, Ur: NEGATIVE

## 2019-10-28 NOTE — Progress Notes (Signed)
Denisia Harpole is a 29 y.o. female who presents to  [place] at [facility]  today, 10/28/19, seeking care for the following:  . Lower abdominal discomfort, concerned about IUD. Has Paragard, placed approx 2016 per patient. Lower abdominal discomfrot, cramping, feel slike pressure, occasionally w/ urination, no burning w/ urination. On exam, normal bimanual exam, cervix appears normal without lesions, IUD strings visible      ASSESSMENT & PLAN with other pertinent findings:  The primary encounter diagnosis was Pelvic pain. Diagnoses of Dysuria and Cervical cancer screening were also pertinent to this visit.   Normal exam, nonspecific symptoms, possible d/t paragard? [place] normal, no fibroid or other uterine/adnexal abnormality to explain symptoms. OK to monitor for now and revaluate if no improvement / worse or change   No results found for this or any previous visit (from the past 24 hour(s)).  There are no Patient Instructions on file for this visit.  Orders Placed This Encounter  Procedures  . US PELVIS TRANSVAGINAL NON-OB (TV ONLY)  . US Pelvis Complete  . POCT urine pregnancy  . POCT Urinalysis Dipstick   Recent Results (from the past 2160 hour(s))  POCT Urinalysis Dipstick     Status: Abnormal   Collection Time: 10/28/19  1:51 PM  Result Value Ref Range   Color, UA yelow    Clarity, UA clear    Glucose, UA Negative Negative   Bilirubin, UA negative    Ketones, UA negative    Spec Grav, UA >=1.030 (A) 1.010 - 1.025   Blood, UA lysed    pH, UA 6.5 5.0 - 8.0   Protein, UA Negative Negative   Urobilinogen, UA 0.2 0.2 or 1.0 E.U./dL   Nitrite, UA negitive    Leukocytes, UA Negative Negative   Appearance     Odor    POCT urine pregnancy     Status: Normal   Collection Time: 10/28/19  1:53 PM  Result Value Ref Range   Preg Test, Ur Negative Negative    No orders of the defined types were placed in this encounter.  US  PELVIS TRANSVAGINAL NON-OB (TV ONLY)  Result Date: 10/29/2019 CLINICAL DATA:  Dysuria, pelvic pain, confirm IUD placement, LMP 10/21/2019 EXAM: TRANSABDOMINAL AND TRANSVAGINAL ULTRASOUND OF PELVIS TECHNIQUE: Both transabdominal and transvaginal ultrasound examinations of the pelvis were performed. Transabdominal technique was performed for global imaging of the pelvis including uterus, ovaries, adnexal regions, and pelvic cul-de-sac. It was necessary to proceed with endovaginal exam following the transabdominal exam to visualize the RIGHT ovary and endometrium. COMPARISON:  None FINDINGS: Uterus Measurements: 7.5 x 3.5 x 4.3 cm = volume: 59 mL. Anteverted. Normal morphology without mass Endometrium Thickness: 5 mm. IUD in expected position at the upper uterine segment endometrial canal. No endometrial fluid or focal abnormality Right ovary Measurements: 4.1 x 2.8 x 2.4 cm = volume: 14 mL. Normal morphology without mass Left ovary Measurements: 4.2 x 2.2 x 2.8 cm = volume: 14 mL. Dominant follicle without mass Other findings No free pelvic fluid.  No adnexal masses. IMPRESSION: IUD in expected position at the upper uterine segment endometrial canal. Otherwise normal exam. Electronically Signed   By: Lavonia Dana M.D.   On: 10/29/2019 14:50   US Pelvis Complete  Result Date: 10/29/2019 CLINICAL DATA:  Dysuria, pelvic pain, confirm IUD placement, LMP 10/21/2019 EXAM: TRANSABDOMINAL AND TRANSVAGINAL ULTRASOUND OF PELVIS TECHNIQUE: Both transabdominal and transvaginal ultrasound examinations of the pelvis were performed. Transabdominal technique was performed for  global imaging of the pelvis including uterus, ovaries, adnexal regions, and pelvic cul-de-sac. It was necessary to proceed with endovaginal exam following the transabdominal exam to visualize the RIGHT ovary and endometrium. COMPARISON:  None FINDINGS: Uterus Measurements: 7.5 x 3.5 x 4.3 cm = volume: 59 mL. Anteverted. Normal morphology without mass  Endometrium Thickness: 5 mm. IUD in expected position at the upper uterine segment endometrial canal. No endometrial fluid or focal abnormality Right ovary Measurements: 4.1 x 2.8 x 2.4 cm = volume: 14 mL. Normal morphology without mass Left ovary Measurements: 4.2 x 2.2 x 2.8 cm = volume: 14 mL. Dominant follicle without mass Other findings No free pelvic fluid.  No adnexal masses. IMPRESSION: IUD in expected position at the upper uterine segment endometrial canal. Otherwise normal exam. Electronically Signed   By: Lavonia Dana M.D.   On: 10/29/2019 14:50        Follow-up instructions: Return if symptoms worsen or fail to improve / pending ultasound results.                                         BP 122/84 (BP Location: Left Arm, Patient Position: Sitting)   Pulse 88   Wt 171 lb (77.6 kg)   LMP 10/21/2019 (Approximate)   SpO2 100%   BMI 31.28 kg/m   No outpatient medications have been marked as taking for the 10/28/19 encounter (Office Visit) with Emeterio Reeve, DO.        All questions at time of visit were answered - patient instructed to contact office with any additional concerns or updates.  ER/RTC precautions were reviewed with the patient as applicable.   Please note: voice recognition software was used to produce this document, and typos may escape review. Please contact Dr. Sheppard Coil for any needed clarifications.

## 2019-10-29 ENCOUNTER — Encounter: Payer: Self-pay | Admitting: Osteopathic Medicine

## 2019-10-29 ENCOUNTER — Ambulatory Visit (INDEPENDENT_AMBULATORY_CARE_PROVIDER_SITE_OTHER): Payer: BC Managed Care – PPO

## 2019-10-29 ENCOUNTER — Other Ambulatory Visit: Payer: Self-pay

## 2019-10-29 DIAGNOSIS — R300 Dysuria: Secondary | ICD-10-CM

## 2019-10-29 DIAGNOSIS — R102 Pelvic and perineal pain: Secondary | ICD-10-CM | POA: Diagnosis not present

## 2019-10-29 DIAGNOSIS — Z30431 Encounter for routine checking of intrauterine contraceptive device: Secondary | ICD-10-CM | POA: Diagnosis not present

## 2019-11-02 LAB — CYTOLOGY - PAP
Comment: NEGATIVE
Diagnosis: NEGATIVE
High risk HPV: NEGATIVE

## 2019-11-05 DIAGNOSIS — J301 Allergic rhinitis due to pollen: Secondary | ICD-10-CM | POA: Diagnosis not present

## 2019-11-05 DIAGNOSIS — J3089 Other allergic rhinitis: Secondary | ICD-10-CM | POA: Diagnosis not present

## 2019-11-05 DIAGNOSIS — J3081 Allergic rhinitis due to animal (cat) (dog) hair and dander: Secondary | ICD-10-CM | POA: Diagnosis not present

## 2019-12-03 DIAGNOSIS — J301 Allergic rhinitis due to pollen: Secondary | ICD-10-CM | POA: Diagnosis not present

## 2019-12-03 DIAGNOSIS — J3081 Allergic rhinitis due to animal (cat) (dog) hair and dander: Secondary | ICD-10-CM | POA: Diagnosis not present

## 2019-12-03 DIAGNOSIS — J3089 Other allergic rhinitis: Secondary | ICD-10-CM | POA: Diagnosis not present

## 2019-12-08 DIAGNOSIS — Z20822 Contact with and (suspected) exposure to covid-19: Secondary | ICD-10-CM | POA: Diagnosis not present

## 2019-12-31 DIAGNOSIS — J3089 Other allergic rhinitis: Secondary | ICD-10-CM | POA: Diagnosis not present

## 2019-12-31 DIAGNOSIS — J3081 Allergic rhinitis due to animal (cat) (dog) hair and dander: Secondary | ICD-10-CM | POA: Diagnosis not present

## 2019-12-31 DIAGNOSIS — J4530 Mild persistent asthma, uncomplicated: Secondary | ICD-10-CM | POA: Diagnosis not present

## 2019-12-31 DIAGNOSIS — J301 Allergic rhinitis due to pollen: Secondary | ICD-10-CM | POA: Diagnosis not present

## 2020-01-28 DIAGNOSIS — J301 Allergic rhinitis due to pollen: Secondary | ICD-10-CM | POA: Diagnosis not present

## 2020-01-28 DIAGNOSIS — J3089 Other allergic rhinitis: Secondary | ICD-10-CM | POA: Diagnosis not present

## 2020-01-28 DIAGNOSIS — J3081 Allergic rhinitis due to animal (cat) (dog) hair and dander: Secondary | ICD-10-CM | POA: Diagnosis not present

## 2020-01-29 DIAGNOSIS — J3081 Allergic rhinitis due to animal (cat) (dog) hair and dander: Secondary | ICD-10-CM | POA: Diagnosis not present

## 2020-01-29 DIAGNOSIS — J301 Allergic rhinitis due to pollen: Secondary | ICD-10-CM | POA: Diagnosis not present

## 2020-01-29 DIAGNOSIS — J3089 Other allergic rhinitis: Secondary | ICD-10-CM | POA: Diagnosis not present

## 2020-02-18 DIAGNOSIS — J301 Allergic rhinitis due to pollen: Secondary | ICD-10-CM | POA: Diagnosis not present

## 2020-02-18 DIAGNOSIS — J3081 Allergic rhinitis due to animal (cat) (dog) hair and dander: Secondary | ICD-10-CM | POA: Diagnosis not present

## 2020-02-18 DIAGNOSIS — J3089 Other allergic rhinitis: Secondary | ICD-10-CM | POA: Diagnosis not present

## 2020-03-17 DIAGNOSIS — J301 Allergic rhinitis due to pollen: Secondary | ICD-10-CM | POA: Diagnosis not present

## 2020-03-17 DIAGNOSIS — J3089 Other allergic rhinitis: Secondary | ICD-10-CM | POA: Diagnosis not present

## 2020-03-17 DIAGNOSIS — J3081 Allergic rhinitis due to animal (cat) (dog) hair and dander: Secondary | ICD-10-CM | POA: Diagnosis not present

## 2020-04-13 DIAGNOSIS — J3081 Allergic rhinitis due to animal (cat) (dog) hair and dander: Secondary | ICD-10-CM | POA: Diagnosis not present

## 2020-04-13 DIAGNOSIS — J301 Allergic rhinitis due to pollen: Secondary | ICD-10-CM | POA: Diagnosis not present

## 2020-04-13 DIAGNOSIS — J3089 Other allergic rhinitis: Secondary | ICD-10-CM | POA: Diagnosis not present

## 2020-05-11 DIAGNOSIS — J3089 Other allergic rhinitis: Secondary | ICD-10-CM | POA: Diagnosis not present

## 2020-05-11 DIAGNOSIS — J301 Allergic rhinitis due to pollen: Secondary | ICD-10-CM | POA: Diagnosis not present

## 2020-05-11 DIAGNOSIS — J3081 Allergic rhinitis due to animal (cat) (dog) hair and dander: Secondary | ICD-10-CM | POA: Diagnosis not present

## 2020-06-08 DIAGNOSIS — J3081 Allergic rhinitis due to animal (cat) (dog) hair and dander: Secondary | ICD-10-CM | POA: Diagnosis not present

## 2020-06-08 DIAGNOSIS — J301 Allergic rhinitis due to pollen: Secondary | ICD-10-CM | POA: Diagnosis not present

## 2020-06-08 DIAGNOSIS — J3089 Other allergic rhinitis: Secondary | ICD-10-CM | POA: Diagnosis not present

## 2020-06-15 ENCOUNTER — Emergency Department (INDEPENDENT_AMBULATORY_CARE_PROVIDER_SITE_OTHER)
Admission: EM | Admit: 2020-06-15 | Discharge: 2020-06-15 | Disposition: A | Discharge status: Home / Self Care | Payer: BC Managed Care – PPO | Source: Home / Self Care | Attending: Family Medicine | Admitting: Family Medicine

## 2020-06-15 ENCOUNTER — Other Ambulatory Visit: Payer: Self-pay

## 2020-06-15 DIAGNOSIS — L0291 Cutaneous abscess, unspecified: Secondary | ICD-10-CM

## 2020-06-15 MED ORDER — SULFAMETHOXAZOLE-TRIMETHOPRIM 800-160 MG PO TABS: 1.0000 | ORAL_TABLET | Freq: Two times a day (BID) | ORAL | 0 refills | Status: AC

## 2020-06-15 NOTE — Discharge Instructions (Addendum)
Take antibiotic 2 times a day May use warm compresses to area After area has healed, you may choose to have the cyst removed Take ibuprofen or Aleve for pain Return as needed

## 2020-06-15 NOTE — ED Triage Notes (Signed)
Pt c/o abscess on LT side of her chest. Said a week ago it started as a pimple like structure and has become more painful since. Also red and warm to touch. Hot compresses as needed. Some scarring from previous abscess.

## 2020-06-15 NOTE — ED Provider Notes (Signed)
[name]    CSN: [id number] Arrival date & time: 06/15/20  1510      History   Chief Complaint Chief Complaint  Patient presents with  . Abscess    HPI Andrea Wheeler is a 30 y.o. female.   HPI   Patient is here for an abscess.  She has a lump underneath her left arm on her side.  Its been lanced twice before.  She has never had a surgery to remove the mass.  It does not cause her any pain unless it comes infected.  At times it is quite large.  Past Medical History:  Diagnosis Date  . 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 Asthma   . Asthma due to seasonal allergies     Patient Active Problem List   Diagnosis Date Noted  . Lipoma of left lower extremity 04/12/2017  . Cyst of skin and subcutaneous tissue 03/18/2017  . Moderate persistent asthma 08/13/2016  . IUD (intrauterine device) in place 08/13/2016    History reviewed. No pertinent surgical history.  OB History   No obstetric history on file.      Home Medications    Prior to Admission medications   Medication Sig Start Date End Date Taking? Authorizing Provider  sulfamethoxazole-trimethoprim (BACTRIM DS) 800-160 MG tablet Take 1 tablet by mouth 2 (two) times daily for 7 days. 06/15/20 06/22/20 Yes Raylene Everts, MD  albuterol (VENTOLIN HFA) 108 (90 Base) MCG/ACT inhaler Inhale 1-2 puffs into the lungs every 4 (four) hours as needed for wheezing or shortness of breath. 01/07/19   Emeterio Reeve, DO  BREO ELLIPTA 200-25 MCG/INH AEPB Inhale 1 puff into the lungs daily. 01/07/19   Emeterio Reeve, DO  loratadine (CLARITIN) 10 MG tablet Take 10 mg by mouth daily.    [provider]  montelukast (SINGULAIR) 10 MG tablet Take 1 tablet (10 mg total) by mouth daily. 01/06/19   Emeterio Reeve, DO  PARAGARD INTRAUTERINE COPPER IUD IUD 1 each by Intrauterine route. Due for removal 2026? 08/13/16   Emeterio Reeve, DO    Family History Family History  Problem Relation Age of Onset  .  Hypertension Mother     Social History Social History   Tobacco Use  . Smoking status: Never Smoker  . Smokeless tobacco: Never Used  Vaping Use  . Vaping Use: Never used  Substance Use Topics  . Alcohol use: No  . Drug use: Never     Allergies   Other   Review of Systems Review of Systems SEE hpi  Physical Exam Triage Vital Signs ED Triage Vitals  Enc Vitals Group     BP 06/15/20 1520 120/76     Pulse Rate 06/15/20 1520 80     Resp 06/15/20 1520 18     Temp 06/15/20 1520 99 F (37.2 C)     Temp Source 06/15/20 1520 Oral     SpO2 06/15/20 1520 100 %     Weight --      Height --      Head Circumference --      Peak Flow --      Pain Score 06/15/20 1523 5     Pain Loc --      Pain Edu? --      Excl. in [place]? --    No data found.  Updated Vital Signs BP 120/76 (BP Location: Left Arm)   Pulse 80   Temp 99 F (37.2 C) (Oral)   Resp 18  SpO2 100%     Physical Exam Constitutional:      General: She is not in acute distress.    Appearance: She is well-developed.  HENT:     Head: Normocephalic and atraumatic.  Eyes:     Conjunctiva/sclera: Conjunctivae normal.     Pupils: Pupils are equal, round, and reactive to light.  Cardiovascular:     Rate and Rhythm: Normal rate.  Pulmonary:     Effort: Pulmonary effort is normal. No respiratory distress.  Abdominal:     General: There is no distension.     Palpations: Abdomen is soft.  Musculoskeletal:        General: Normal range of motion.     Cervical back: Normal range of motion.  Skin:    General: Skin is warm and dry.       Neurological:     Mental Status: She is alert.      UC Treatments / Results  Labs (all labs ordered are listed, but only abnormal results are displayed) Labs Reviewed - No data to display  EKG   Radiology No results found.  Procedures Incision and Drainage  Date/Time: 06/15/2020 5:46 PM Performed by: Raylene Everts, MD Authorized by: Raylene Everts, MD    Consent:    Consent obtained:  Verbal   Consent given by:  Patient   Risks, benefits, and alternatives were discussed: yes     Risks discussed:  Incomplete drainage and pain   Alternatives discussed:  No treatment Universal protocol:    Patient identity confirmed:  Arm band and verbally with patient Location:    Type:  Abscess   Location:  Trunk   Trunk location:  Chest Pre-procedure details:    Skin preparation:  Antiseptic wash Anesthesia:    Anesthesia method:  Local infiltration   Local anesthetic:  Lidocaine 1% WITH epi Procedure type:    Complexity:  Simple Procedure details:    Incision types:  Stab incision   Incision depth:  Subcutaneous   Wound management:  Probed and deloculated   Drainage:  Purulent   Drainage amount:  Moderate   Wound treatment:  Wound left open   Packing materials:  None Post-procedure details:    Procedure completion:  Tolerated Comments:     Incision of the mass initially released moderate purulence, then sebum was pressed out of the opening with difficulty.   (including critical care time)  Medications Ordered in UC Medications - No data to display  Initial Impression / Assessment and Plan / UC Course  I have reviewed the triage vital signs and the nursing notes.  Pertinent labs & imaging results that were available during my care of the patient were reviewed by me and considered in my medical decision making (see chart for details).     Reviewed wound care.  Discussed with patient that she needs to have the cyst removed in order to get rid of this problem permanently Final Clinical Impressions(s) / UC Diagnoses   Final diagnoses:  Abscess     Discharge Instructions     Take antibiotic 2 times a day May use warm compresses to area After area has healed, you may choose to have the cyst removed Take ibuprofen or Aleve for pain Return as needed   ED Prescriptions    Medication Sig Dispense Auth. Provider    sulfamethoxazole-trimethoprim (BACTRIM DS) 800-160 MG tablet Take 1 tablet by mouth 2 (two) times daily for 7 days. 14 tablet Raylene Everts, MD  PDMP not reviewed this encounter.   Raylene Everts, MD 06/15/20 [phone]

## 2020-06-21 ENCOUNTER — Ambulatory Visit: Payer: BC Managed Care – PPO | Admitting: Osteopathic Medicine

## 2020-06-22 ENCOUNTER — Encounter: Payer: Self-pay | Admitting: Osteopathic Medicine

## 2020-06-22 ENCOUNTER — Ambulatory Visit (INDEPENDENT_AMBULATORY_CARE_PROVIDER_SITE_OTHER): Payer: BC Managed Care – PPO | Admitting: Osteopathic Medicine

## 2020-06-22 ENCOUNTER — Other Ambulatory Visit: Payer: Self-pay

## 2020-06-22 VITALS — BP 108/70 | HR 74 | Temp 98.0°F | Wt 167.1 lb

## 2020-06-22 DIAGNOSIS — L723 Sebaceous cyst: Secondary | ICD-10-CM | POA: Diagnosis not present

## 2020-06-22 DIAGNOSIS — L089 Local infection of the skin and subcutaneous tissue, unspecified: Secondary | ICD-10-CM | POA: Diagnosis not present

## 2020-06-22 NOTE — Progress Notes (Signed)
Andrea Wheeler is a 30 y.o. female who presents to  [place] [facility]  today, 06/22/20, seeking care for the following: f/u cyst/abscess  . Urgent care follow-up: seen 06/15/20 (1 week ago) for recurrent abscess / cyst on L torso/axilla, s/p I&D x3 at this point, first was few years ago. No pain unless infected. Purulence initially expressed then sebum. Pt was Rx Bactrim, advised use warm compresses, take ibuprofen or aleve for pain, and f/u prn.  . Pt here today 06/22/20 for follow-up. Pt reports it's healing well and still taking antibiotics as prescribed.      ASSESSMENT & PLAN with other pertinent findings:  The encounter diagnosis was Infected sebaceous cyst.Healing well. Pt reports not sure when IUD due to exchange.    Pt advised if this area starts growing again, let me know and we can refer to dermatology or gen surg for complete cyst excision.   Pt advised call [facility] to confirm date of IUD insertion - can remove/exchange here as needed. Per our records not sure either when due, think 2026. Pap UTD.   There are no Patient Instructions on file for this visit.  No orders of the defined types were placed in this encounter.   No orders of the defined types were placed in this encounter.    See below for relevant physical exam findings  See below for recent lab and imaging results reviewed  Medications, allergies, PMH, PSH, SocH, FamH reviewed below    Follow-up instructions: Return in about 1 year (around 06/22/2021) for ANNUAL CHECK-UP - SEE [place] SOONER IF NEEDED.                                        Exam:  There were no vitals taken for this visit.  Constitutional: VS see above. General Appearance: alert, well-developed, well-nourished, NAD  Neck: No masses, trachea midline.   Respiratory: Normal respiratory effort.   Musculoskeletal: Gait normal. Symmetric and  independent movement of all extremities  Neurological: Normal balance/coordination. No tremor.  Skin: warm, dry, intact. L torso area s/p incision appears to be healing well, no redness/edema, no fluctuance   Psychiatric: Normal judgment/insight. Normal mood and affect. Oriented x3.   No outpatient medications have been marked as taking for the 06/22/20 encounter (Appointment) with Emeterio Reeve, DO.    Allergies  Allergen Reactions  . Other Itching    As per patient - allergic to walnuts, causes itching in mouth & throat    Patient Active Problem List   Diagnosis Date Noted  . Lipoma of left lower extremity 04/12/2017  . Cyst of skin and subcutaneous tissue 03/18/2017  . Moderate persistent asthma 08/13/2016  . IUD (intrauterine device) in place 08/13/2016    Family History  Problem Relation Age of Onset  . Hypertension Mother     Social History   Tobacco Use  Smoking Status Never Smoker  Smokeless Tobacco Never Used    No past surgical history on file.  Immunization History  Administered Date(s) Administered  . Influenza-Unspecified 12/01/2016  . Moderna Sars-Covid-2 Vaccination 07/29/2019, 08/26/2019  . Tdap 10/16/2017    No results found for this or any previous visit (from the past 2160 hour(s)).  No results found.     All questions at time of visit were answered - patient instructed to contact office with any additional concerns or  updates. ER/RTC precautions were reviewed with the patient as applicable.   Please note: manual typing as well as voice recognition software may have been used to produce this document - typos may escape review. Please contact Dr. Sheppard Coil for any needed clarifications.

## 2020-07-06 DIAGNOSIS — J3089 Other allergic rhinitis: Secondary | ICD-10-CM | POA: Diagnosis not present

## 2020-07-06 DIAGNOSIS — J301 Allergic rhinitis due to pollen: Secondary | ICD-10-CM | POA: Diagnosis not present

## 2020-07-06 DIAGNOSIS — J3081 Allergic rhinitis due to animal (cat) (dog) hair and dander: Secondary | ICD-10-CM | POA: Diagnosis not present

## 2020-08-03 DIAGNOSIS — J301 Allergic rhinitis due to pollen: Secondary | ICD-10-CM | POA: Diagnosis not present

## 2020-08-03 DIAGNOSIS — J3089 Other allergic rhinitis: Secondary | ICD-10-CM | POA: Diagnosis not present

## 2020-08-03 DIAGNOSIS — J3081 Allergic rhinitis due to animal (cat) (dog) hair and dander: Secondary | ICD-10-CM | POA: Diagnosis not present

## 2020-09-07 DIAGNOSIS — J3089 Other allergic rhinitis: Secondary | ICD-10-CM | POA: Diagnosis not present

## 2020-09-07 DIAGNOSIS — J301 Allergic rhinitis due to pollen: Secondary | ICD-10-CM | POA: Diagnosis not present

## 2020-09-07 DIAGNOSIS — J3081 Allergic rhinitis due to animal (cat) (dog) hair and dander: Secondary | ICD-10-CM | POA: Diagnosis not present

## 2020-10-05 DIAGNOSIS — J3081 Allergic rhinitis due to animal (cat) (dog) hair and dander: Secondary | ICD-10-CM | POA: Diagnosis not present

## 2020-10-05 DIAGNOSIS — J3089 Other allergic rhinitis: Secondary | ICD-10-CM | POA: Diagnosis not present

## 2020-10-05 DIAGNOSIS — J301 Allergic rhinitis due to pollen: Secondary | ICD-10-CM | POA: Diagnosis not present

## 2020-11-02 DIAGNOSIS — J3081 Allergic rhinitis due to animal (cat) (dog) hair and dander: Secondary | ICD-10-CM | POA: Diagnosis not present

## 2020-11-02 DIAGNOSIS — J3089 Other allergic rhinitis: Secondary | ICD-10-CM | POA: Diagnosis not present

## 2020-11-02 DIAGNOSIS — J301 Allergic rhinitis due to pollen: Secondary | ICD-10-CM | POA: Diagnosis not present

## 2020-11-30 DIAGNOSIS — J301 Allergic rhinitis due to pollen: Secondary | ICD-10-CM | POA: Diagnosis not present

## 2020-11-30 DIAGNOSIS — J3081 Allergic rhinitis due to animal (cat) (dog) hair and dander: Secondary | ICD-10-CM | POA: Diagnosis not present

## 2020-11-30 DIAGNOSIS — J3089 Other allergic rhinitis: Secondary | ICD-10-CM | POA: Diagnosis not present

## 2020-12-19 DIAGNOSIS — J301 Allergic rhinitis due to pollen: Secondary | ICD-10-CM | POA: Diagnosis not present

## 2020-12-19 DIAGNOSIS — J3089 Other allergic rhinitis: Secondary | ICD-10-CM | POA: Diagnosis not present

## 2020-12-19 DIAGNOSIS — J3081 Allergic rhinitis due to animal (cat) (dog) hair and dander: Secondary | ICD-10-CM | POA: Diagnosis not present

## 2020-12-28 DIAGNOSIS — J3081 Allergic rhinitis due to animal (cat) (dog) hair and dander: Secondary | ICD-10-CM | POA: Diagnosis not present

## 2020-12-28 DIAGNOSIS — J301 Allergic rhinitis due to pollen: Secondary | ICD-10-CM | POA: Diagnosis not present

## 2020-12-28 DIAGNOSIS — J3089 Other allergic rhinitis: Secondary | ICD-10-CM | POA: Diagnosis not present

## 2021-01-24 DIAGNOSIS — J3089 Other allergic rhinitis: Secondary | ICD-10-CM | POA: Diagnosis not present

## 2021-01-24 DIAGNOSIS — J301 Allergic rhinitis due to pollen: Secondary | ICD-10-CM | POA: Diagnosis not present

## 2021-01-24 DIAGNOSIS — J3081 Allergic rhinitis due to animal (cat) (dog) hair and dander: Secondary | ICD-10-CM | POA: Diagnosis not present

## 2021-02-20 DIAGNOSIS — J3081 Allergic rhinitis due to animal (cat) (dog) hair and dander: Secondary | ICD-10-CM | POA: Diagnosis not present

## 2021-02-20 DIAGNOSIS — J3089 Other allergic rhinitis: Secondary | ICD-10-CM | POA: Diagnosis not present

## 2021-02-20 DIAGNOSIS — J301 Allergic rhinitis due to pollen: Secondary | ICD-10-CM | POA: Diagnosis not present

## 2021-03-21 DIAGNOSIS — J3089 Other allergic rhinitis: Secondary | ICD-10-CM | POA: Diagnosis not present

## 2021-03-21 DIAGNOSIS — J301 Allergic rhinitis due to pollen: Secondary | ICD-10-CM | POA: Diagnosis not present

## 2021-03-21 DIAGNOSIS — J3081 Allergic rhinitis due to animal (cat) (dog) hair and dander: Secondary | ICD-10-CM | POA: Diagnosis not present

## 2021-08-17 IMAGING — US US TRANSVAGINAL NON-OB
1 series · 14 of 25 positions shown · non-contrast
Comparison: None

CLINICAL DATA: Dysuria, pelvic pain, confirm IUD placement, LMP
10/21/2019

EXAM:
TRANSABDOMINAL AND TRANSVAGINAL ULTRASOUND OF PELVIS
TECHNIQUE: Both transabdominal and transvaginal ultrasound examinations of the
pelvis were performed. Transabdominal technique was performed for
global imaging of the pelvis including uterus, ovaries, adnexal
regions, and pelvic cul-de-sac. It was necessary to proceed with
endovaginal exam following the transabdominal exam to visualize the
RIGHT ovary and endometrium.

[Series 1: us transvaginal non-ob · 0.12mm/px · 14 of 73 slices shown]
[im 1/73]
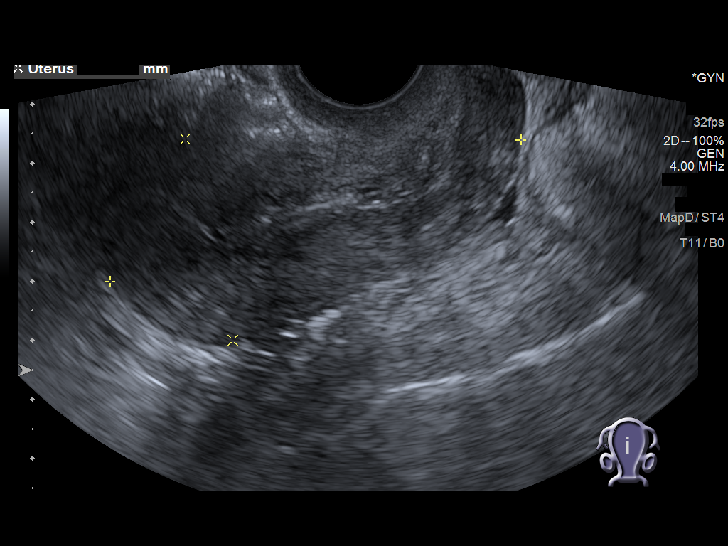
[im 7/73]
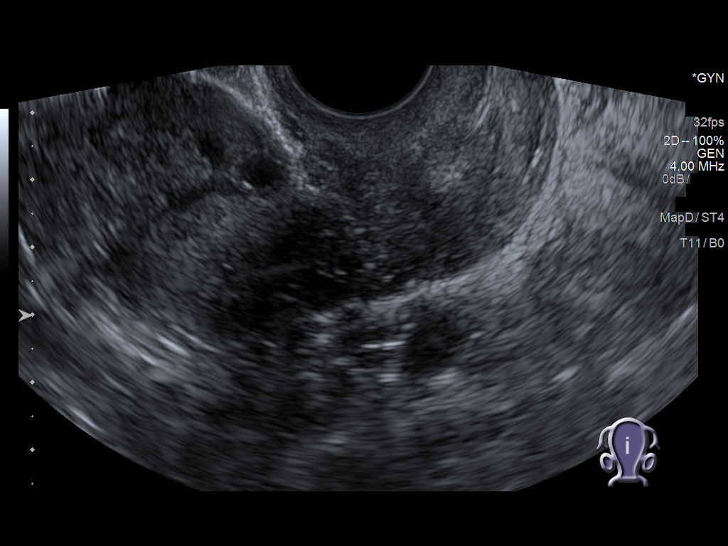
[im 13/73]
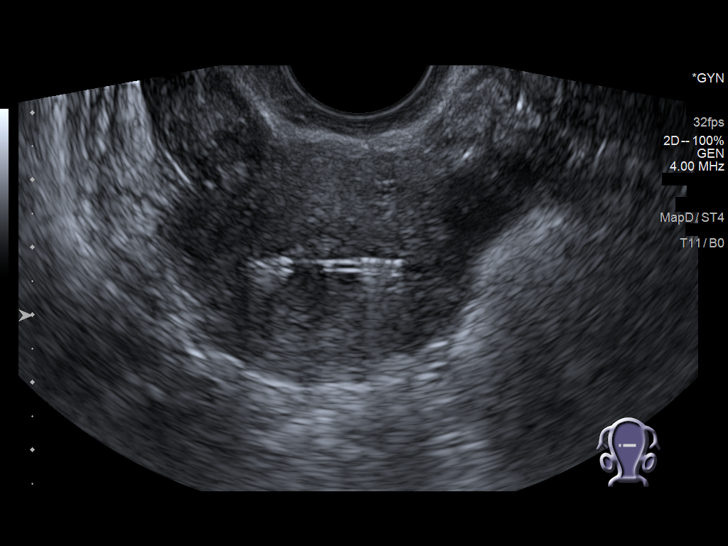
[im 19/73]
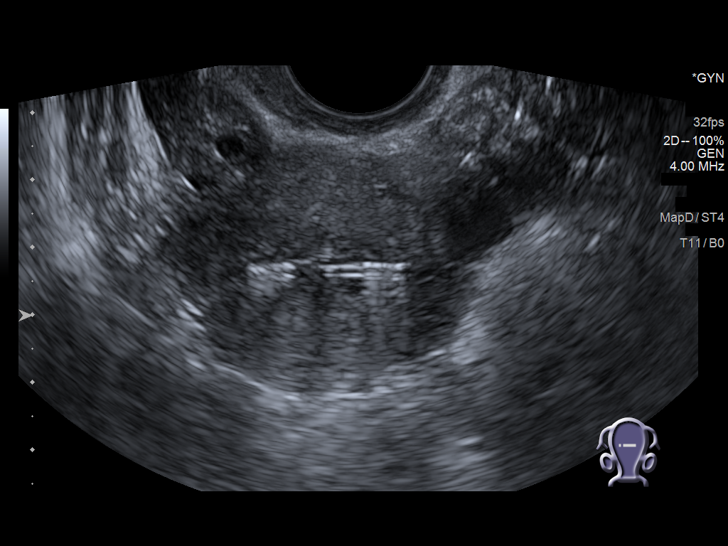
[im 25/73]
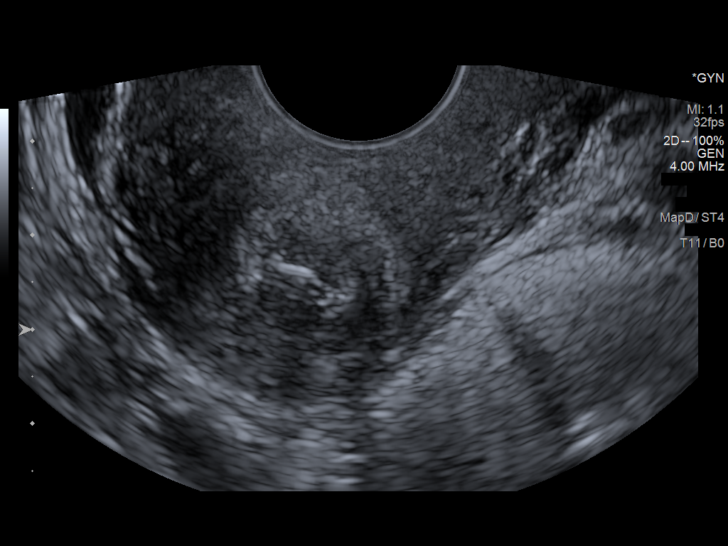
[im 28/73]
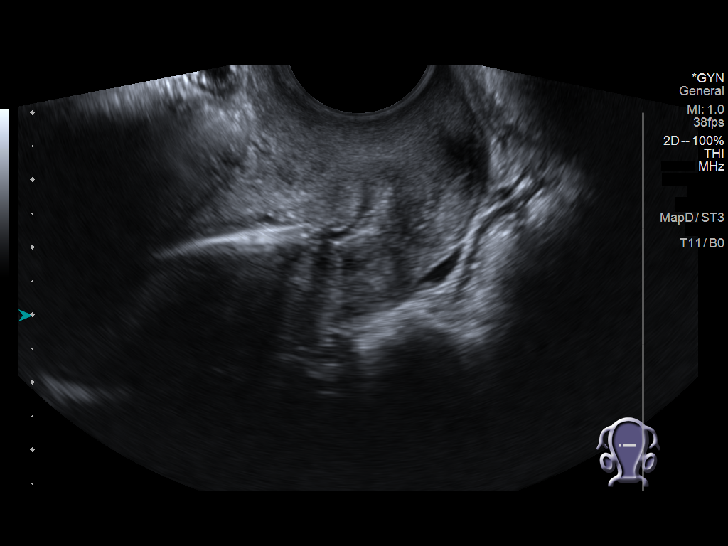
[im 34/73]
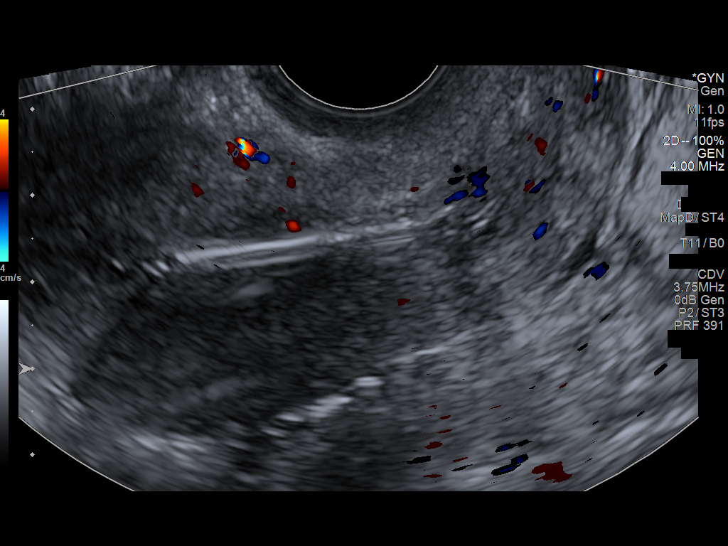
[im 40/73]
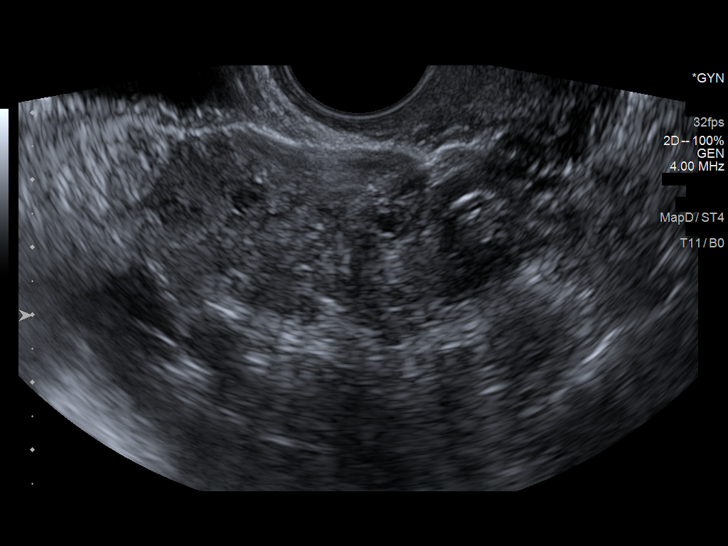
[im 46/73]
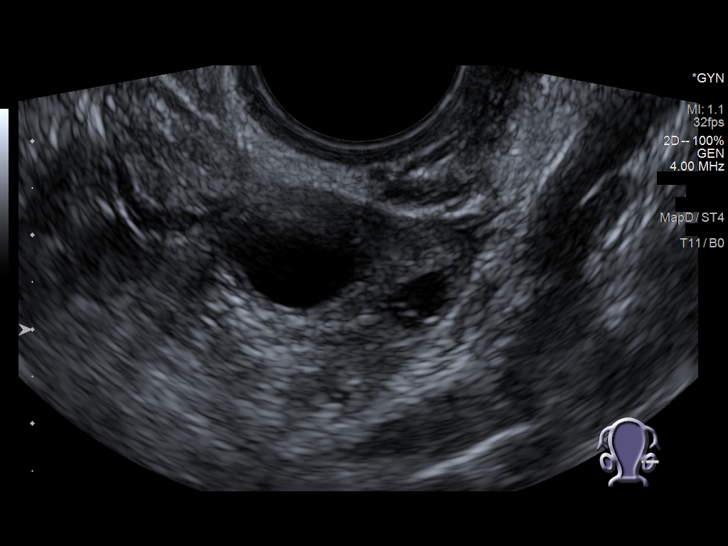
[im 49/73]
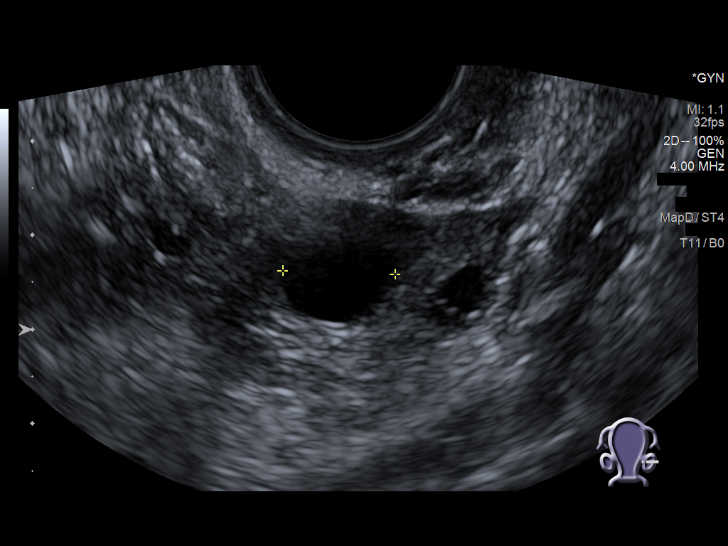
[im 55/73]
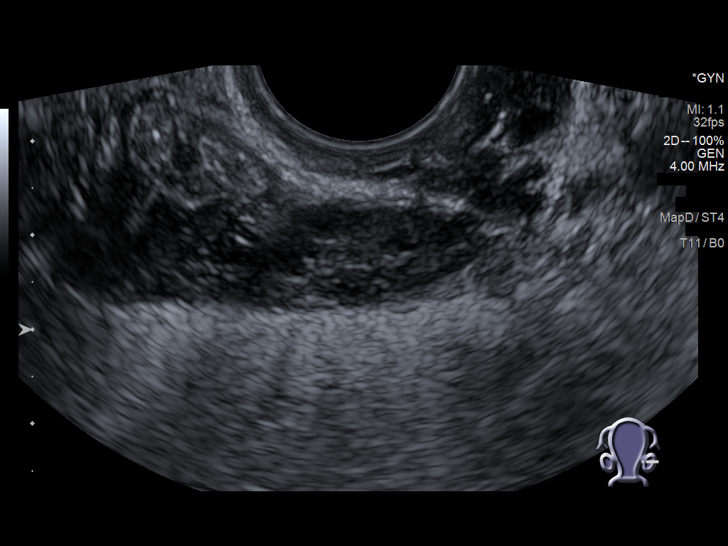
[im 61/73]
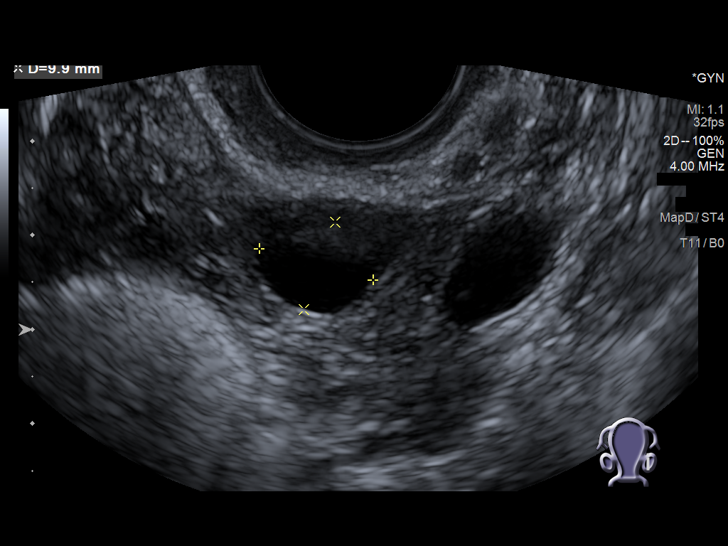
[im 67/73]
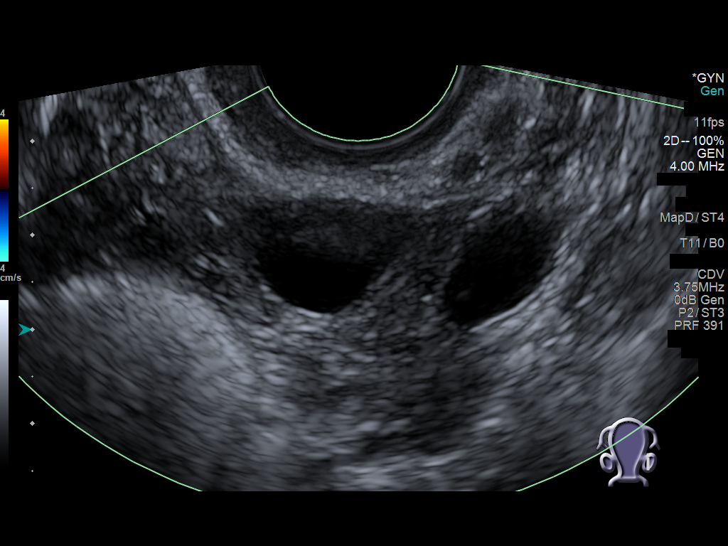
[im 73/73]
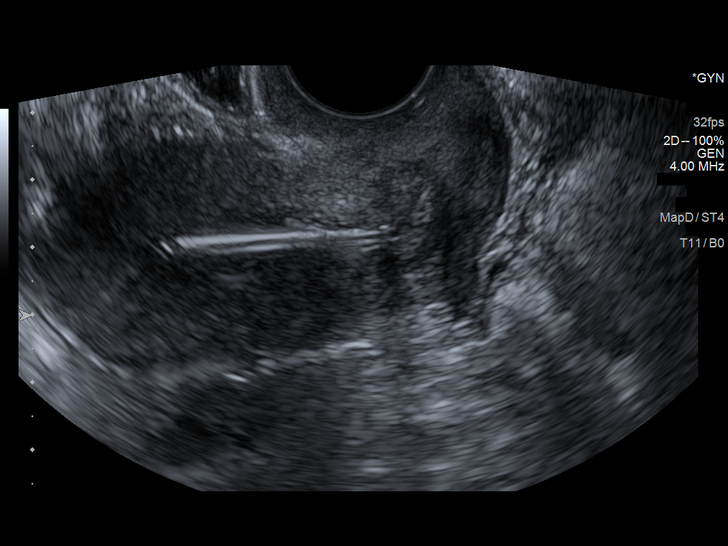

[14 of 25 positions shown; findings below may reference images not displayed]

FINDINGS: Uterus

Measurements: 7.5 x 3.5 x 4.3 cm = volume: 59 mL. Anteverted. Normal
morphology without mass

Endometrium

Thickness: 5 mm. IUD in expected position at the upper uterine
segment endometrial canal. No endometrial fluid or focal abnormality

Right ovary

Measurements: 4.1 x 2.8 x 2.4 cm = volume: 14 mL. Normal morphology
without mass

Left ovary

Measurements: 4.2 x 2.2 x 2.8 cm = volume: 14 mL. Dominant follicle
without mass

Other findings

No free pelvic fluid.  No adnexal masses.
IMPRESSION: IUD in expected position at the upper uterine segment endometrial
canal.

Otherwise normal exam.

## 2021-08-17 IMAGING — US US PELVIS COMPLETE
1 series · 14 of 25 positions shown · non-contrast
Comparison: None

CLINICAL DATA: Dysuria, pelvic pain, confirm IUD placement, LMP
10/21/2019

EXAM:
TRANSABDOMINAL AND TRANSVAGINAL ULTRASOUND OF PELVIS
TECHNIQUE: Both transabdominal and transvaginal ultrasound examinations of the
pelvis were performed. Transabdominal technique was performed for
global imaging of the pelvis including uterus, ovaries, adnexal
regions, and pelvic cul-de-sac. It was necessary to proceed with
endovaginal exam following the transabdominal exam to visualize the
RIGHT ovary and endometrium.

[Series 1: us pelvis complete · 0.25mm/px · 14 of 50 slices shown]
[im 1/50]
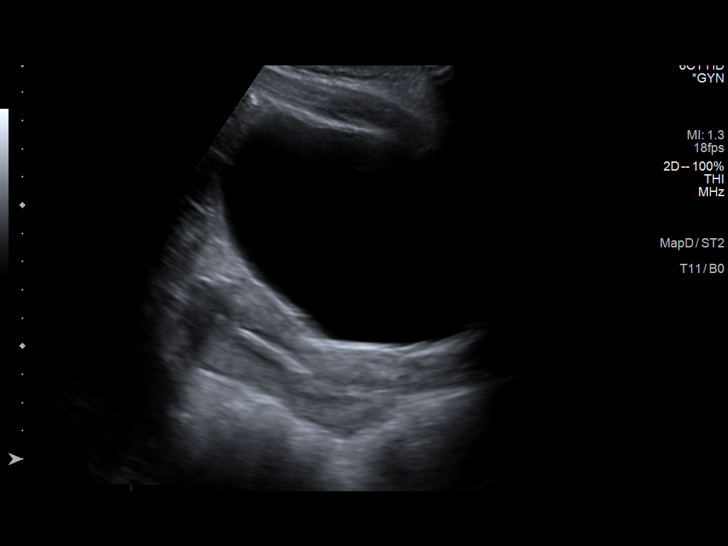
[im 5/50]
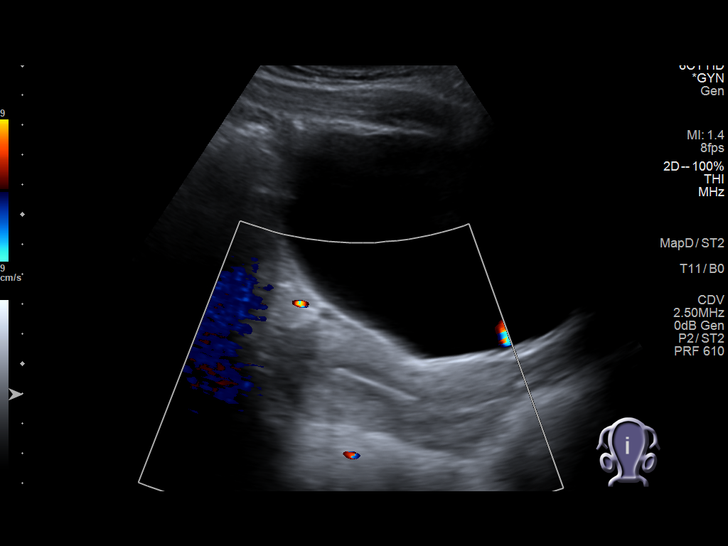
[im 9/50]
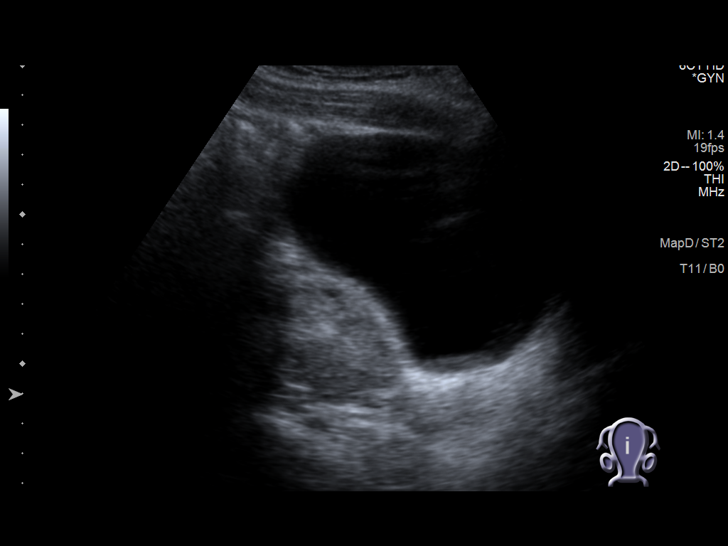
[im 13/50]
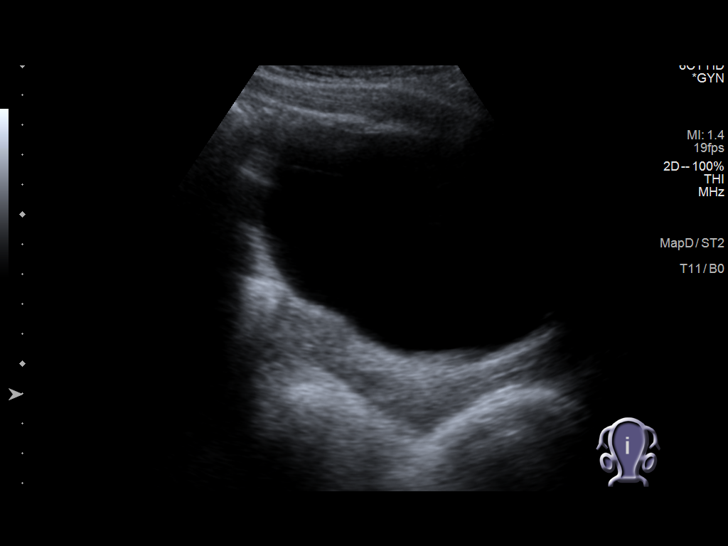
[im 17/50]
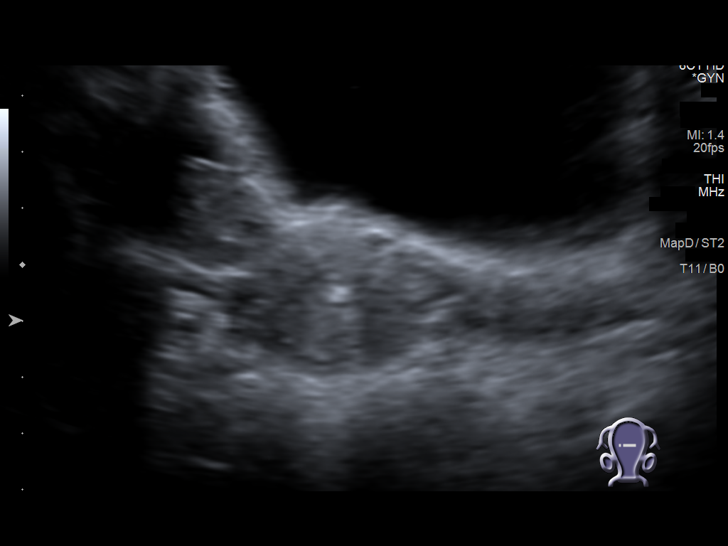
[im 19/50]
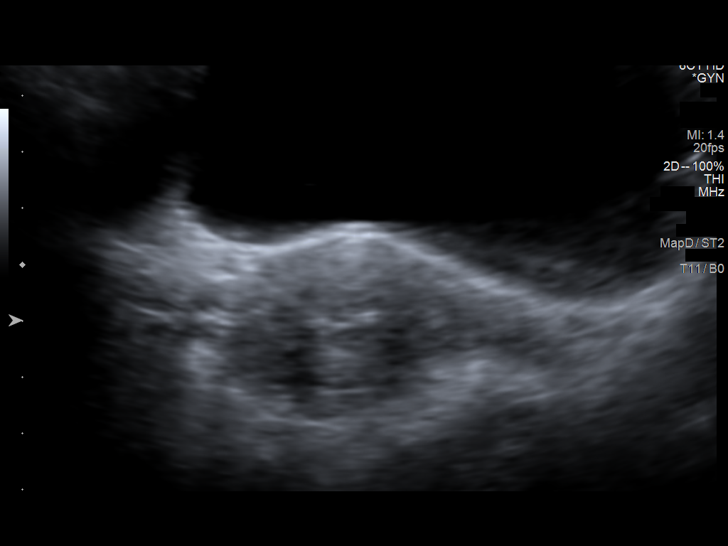
[im 23/50]
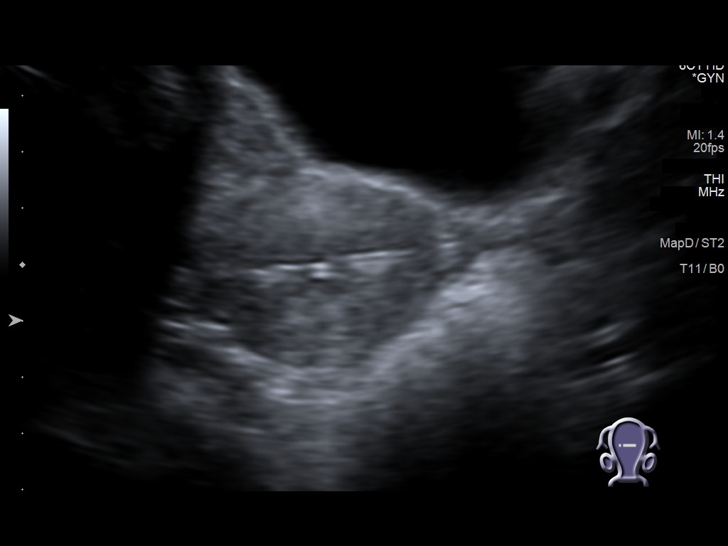
[im 27/50]
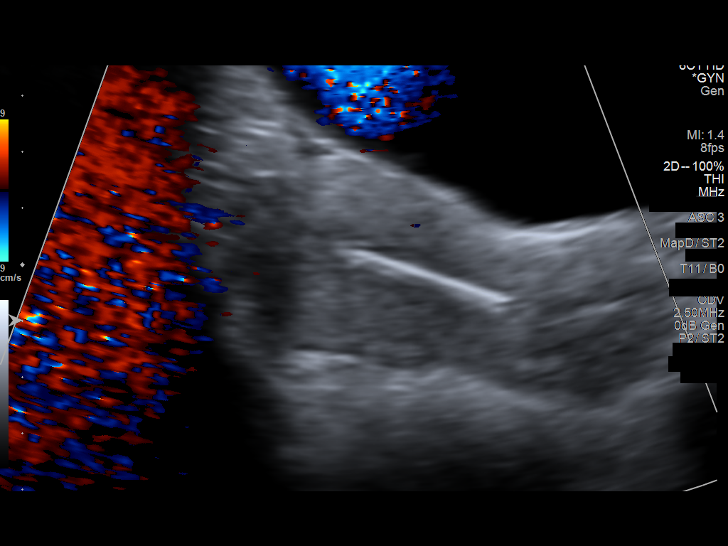
[im 31/50]
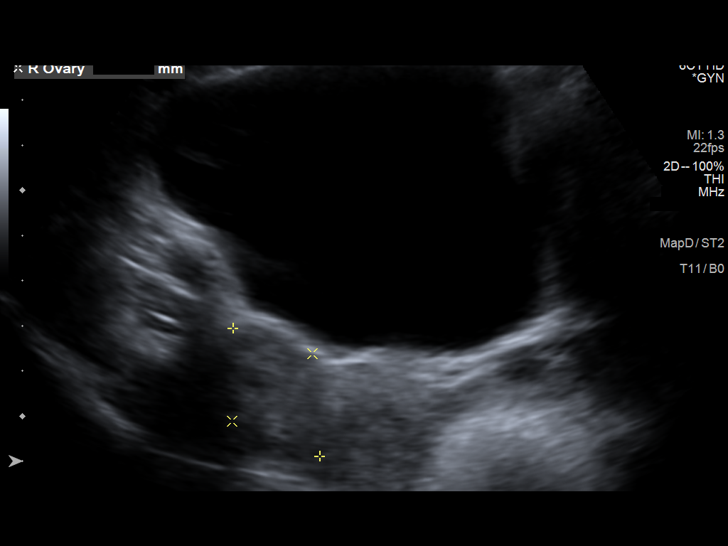
[im 33/50]
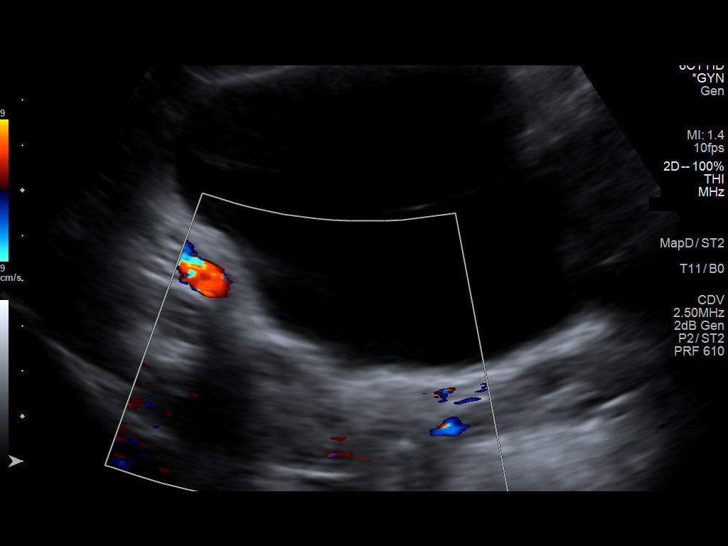
[im 37/50]
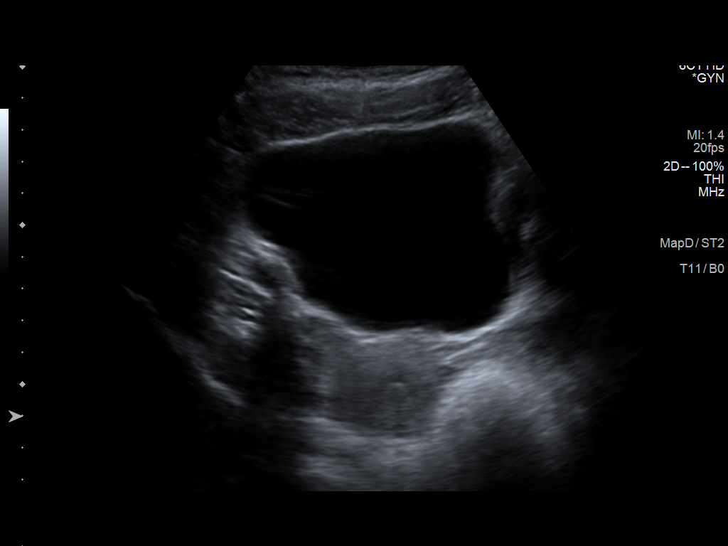
[im 41/50]
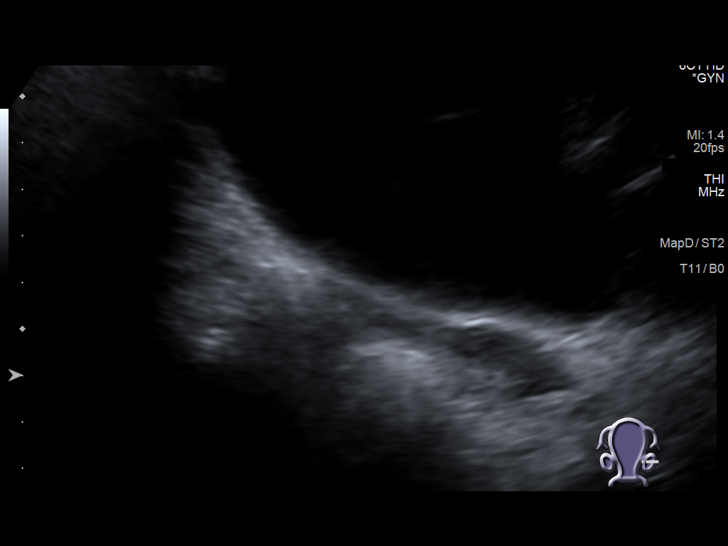
[im 45/50]
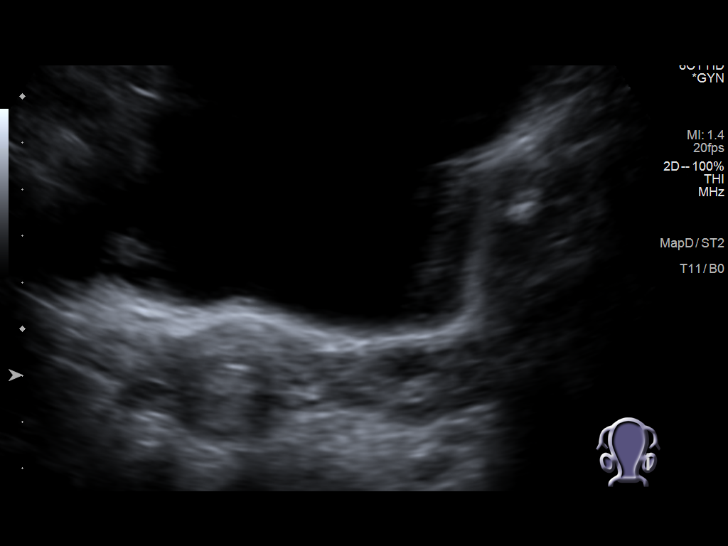
[im 50/50]
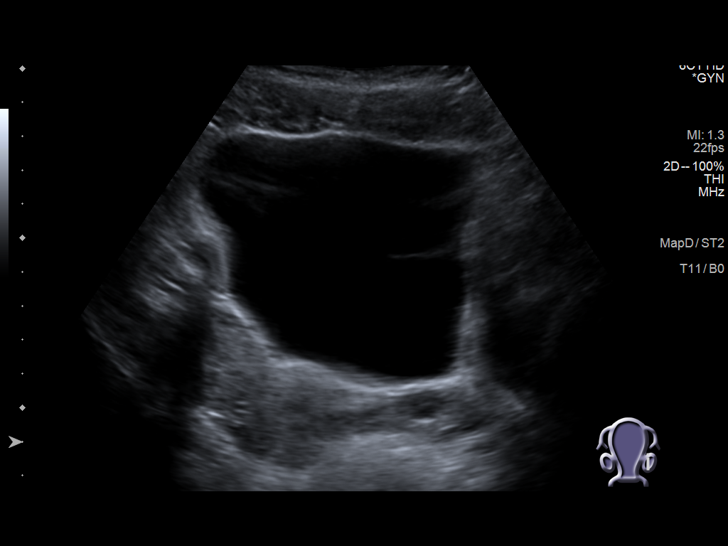

[14 of 25 positions shown; findings below may reference images not displayed]

FINDINGS: Uterus

Measurements: 7.5 x 3.5 x 4.3 cm = volume: 59 mL. Anteverted. Normal
morphology without mass

Endometrium

Thickness: 5 mm. IUD in expected position at the upper uterine
segment endometrial canal. No endometrial fluid or focal abnormality

Right ovary

Measurements: 4.1 x 2.8 x 2.4 cm = volume: 14 mL. Normal morphology
without mass

Left ovary

Measurements: 4.2 x 2.2 x 2.8 cm = volume: 14 mL. Dominant follicle
without mass

Other findings

No free pelvic fluid.  No adnexal masses.
IMPRESSION: IUD in expected position at the upper uterine segment endometrial
canal.

Otherwise normal exam.
